# Patient Record
Sex: Male | Born: 1975 | Race: Black or African American | Hispanic: No | Marital: Married | Smoking: Never smoker
Health system: Southern US, Community
[De-identification: ages and names within clinical notes are randomized; demographics above are authoritative.]

## PROBLEM LIST (undated history)

## (undated) DIAGNOSIS — N050 Unspecified nephritic syndrome with minor glomerular abnormality: Secondary | ICD-10-CM

## (undated) DIAGNOSIS — M1990 Unspecified osteoarthritis, unspecified site: Secondary | ICD-10-CM

## (undated) HISTORY — PX: NO PAST SURGERIES: SHX2092

## (undated) HISTORY — PX: JOINT REPLACEMENT: SHX530

---

## 1998-01-27 ENCOUNTER — Observation Stay (HOSPITAL_COMMUNITY): Admission: EM | Admit: 1998-01-27 | Discharge: 1998-01-28 | Payer: Self-pay | Admitting: Emergency Medicine

## 1998-02-01 ENCOUNTER — Encounter: Admission: RE | Admit: 1998-02-01 | Discharge: 1998-02-01 | Payer: Self-pay | Admitting: Family Medicine

## 1999-12-25 ENCOUNTER — Emergency Department (HOSPITAL_COMMUNITY): Admission: EM | Admit: 1999-12-25 | Discharge: 1999-12-25 | Payer: Self-pay | Admitting: Emergency Medicine

## 2005-03-12 ENCOUNTER — Emergency Department (HOSPITAL_COMMUNITY): Admission: EM | Admit: 2005-03-12 | Discharge: 2005-03-13 | Payer: Self-pay | Admitting: Emergency Medicine

## 2005-03-14 ENCOUNTER — Emergency Department: Payer: Self-pay | Admitting: Emergency Medicine

## 2005-03-28 ENCOUNTER — Encounter: Admission: RE | Admit: 2005-03-28 | Discharge: 2005-03-28 | Payer: Self-pay | Admitting: Neurology

## 2010-07-20 ENCOUNTER — Other Ambulatory Visit (HOSPITAL_COMMUNITY): Payer: Self-pay | Admitting: Nephrology

## 2010-07-20 DIAGNOSIS — R809 Proteinuria, unspecified: Secondary | ICD-10-CM

## 2010-07-28 ENCOUNTER — Ambulatory Visit (HOSPITAL_COMMUNITY)
Admission: RE | Admit: 2010-07-28 | Discharge: 2010-07-28 | Disposition: A | Discharge status: Home / Self Care | Payer: BC Managed Care – PPO | Source: Ambulatory Visit | Attending: Nephrology | Admitting: Nephrology

## 2010-07-28 ENCOUNTER — Other Ambulatory Visit: Payer: Self-pay | Admitting: Interventional Radiology

## 2010-07-28 DIAGNOSIS — R809 Proteinuria, unspecified: Secondary | ICD-10-CM | POA: Insufficient documentation

## 2010-07-28 DIAGNOSIS — N059 Unspecified nephritic syndrome with unspecified morphologic changes: Secondary | ICD-10-CM | POA: Insufficient documentation

## 2010-07-28 DIAGNOSIS — Z01812 Encounter for preprocedural laboratory examination: Secondary | ICD-10-CM | POA: Insufficient documentation

## 2010-07-28 LAB — CBC
HCT: 47.1 % (ref 39.0–52.0)
Hemoglobin: 16.5 g/dL (ref 13.0–17.0)
MCH: 30.4 pg (ref 26.0–34.0)
MCHC: 35 g/dL (ref 30.0–36.0)
MCV: 86.7 fL (ref 78.0–100.0)
Platelets: 225 10*3/uL (ref 150–400)
RDW: 12.8 % (ref 11.5–15.5)
WBC: 8.1 10*3/uL (ref 4.0–10.5)

## 2010-07-28 LAB — APTT: aPTT: 31 seconds (ref 24–37)

## 2010-07-28 LAB — PROTIME-INR
INR: 0.89 (ref 0.00–1.49)
Prothrombin Time: 12.3 seconds (ref 11.6–15.2)

## 2010-09-04 ENCOUNTER — Emergency Department (HOSPITAL_COMMUNITY)
Admission: EM | Admit: 2010-09-04 | Discharge: 2010-09-04 | Disposition: A | Discharge status: Home / Self Care | Payer: BC Managed Care – PPO | Attending: Emergency Medicine | Admitting: Emergency Medicine

## 2010-09-04 DIAGNOSIS — Z79899 Other long term (current) drug therapy: Secondary | ICD-10-CM | POA: Insufficient documentation

## 2010-09-04 DIAGNOSIS — M79609 Pain in unspecified limb: Secondary | ICD-10-CM | POA: Insufficient documentation

## 2010-09-04 DIAGNOSIS — M25569 Pain in unspecified knee: Secondary | ICD-10-CM | POA: Insufficient documentation

## 2010-09-04 DIAGNOSIS — I10 Essential (primary) hypertension: Secondary | ICD-10-CM | POA: Insufficient documentation

## 2010-09-04 LAB — CBC
HCT: 42.2 % (ref 39.0–52.0)
Hemoglobin: 14.3 g/dL (ref 13.0–17.0)
MCH: 29.4 pg (ref 26.0–34.0)
MCHC: 33.9 g/dL (ref 30.0–36.0)
MCV: 86.8 fL (ref 78.0–100.0)
Platelets: 135 10*3/uL — ABNORMAL LOW (ref 150–400)
RDW: 13.9 % (ref 11.5–15.5)
WBC: 14.7 10*3/uL — ABNORMAL HIGH (ref 4.0–10.5)

## 2010-09-04 LAB — BASIC METABOLIC PANEL
BUN: 17 mg/dL (ref 6–23)
CO2: 28 mEq/L (ref 19–32)
Calcium: 8.8 mg/dL (ref 8.4–10.5)
Creatinine, Ser: 0.81 mg/dL (ref 0.4–1.5)
GFR calc Af Amer: >60 mL/min (ref >60)
GFR calc non Af Amer: >60 mL/min (ref >60)
Glucose, Bld: 91 mg/dL (ref 70–99)
Potassium: 3.4 mEq/L — ABNORMAL LOW (ref 3.5–5.1)
Sodium: 139 mEq/L (ref 135–145)

## 2010-09-04 LAB — DIFFERENTIAL
Basophils Absolute: 0 10*3/uL (ref 0.0–0.1)
Basophils Relative: 0 % (ref 0–1)
Eosinophils Absolute: 0 10*3/uL (ref 0.0–0.7)
Eosinophils Relative: 0 % (ref 0–5)
Lymphocytes Relative: 32 % (ref 12–46)
Lymphs Abs: 4.7 10*3/uL — ABNORMAL HIGH (ref 0.7–4.0)
Monocytes Absolute: 0.8 10*3/uL (ref 0.1–1.0)
Monocytes Relative: 5 % (ref 3–12)
Neutrophils Relative %: 62 % (ref 43–77)

## 2010-09-04 LAB — SEDIMENTATION RATE: Sed Rate: 16 mm/hr (ref 0–16)

## 2014-02-02 ENCOUNTER — Emergency Department (HOSPITAL_COMMUNITY)
Admission: EM | Admit: 2014-02-02 | Discharge: 2014-02-02 | Disposition: A | Discharge status: Home / Self Care | Payer: BC Managed Care – PPO | Attending: Emergency Medicine | Admitting: Emergency Medicine

## 2014-02-02 ENCOUNTER — Encounter (HOSPITAL_COMMUNITY): Payer: Self-pay | Admitting: Emergency Medicine

## 2014-02-02 ENCOUNTER — Other Ambulatory Visit: Payer: Self-pay

## 2014-02-02 ENCOUNTER — Emergency Department (HOSPITAL_COMMUNITY): Payer: BC Managed Care – PPO

## 2014-02-02 DIAGNOSIS — Z87448 Personal history of other diseases of urinary system: Secondary | ICD-10-CM | POA: Diagnosis not present

## 2014-02-02 DIAGNOSIS — R1011 Right upper quadrant pain: Secondary | ICD-10-CM | POA: Diagnosis present

## 2014-02-02 DIAGNOSIS — R0789 Other chest pain: Secondary | ICD-10-CM

## 2014-02-02 HISTORY — DX: Unspecified nephritic syndrome with minor glomerular abnormality: N05.0

## 2014-02-02 MED ORDER — ACETAMINOPHEN 325 MG PO TABS
650.0000 mg | ORAL_TABLET | Freq: Once | ORAL | Status: AC
  Administered 2014-02-02: 650 mg via ORAL
  Filled 2014-02-02: qty 2

## 2014-02-02 NOTE — Discharge Instructions (Signed)
Chest Wall Pain °Chest wall pain is pain in or around the bones and muscles of your chest. It may take up to 6 weeks to get better. It may take longer if you must stay physically active in your work and activities.  °CAUSES  °Chest wall pain may happen on its own. However, it may be caused by: °· A viral illness like the flu. °· Injury. °· Coughing. °· Exercise. °· Arthritis. °· Fibromyalgia. °· Shingles. °HOME CARE INSTRUCTIONS  °· Avoid overtiring physical activity. Try not to strain or perform activities that cause pain. This includes any activities using your chest or your abdominal and side muscles, especially if heavy weights are used. °· Put ice on the sore area. °· Put ice in a plastic bag. °· Place a towel between your skin and the bag. °· Leave the ice on for 15-20 minutes per hour while awake for the first 2 days. °· Only take over-the-counter or prescription medicines for pain, discomfort, or fever as directed by your caregiver. °SEEK IMMEDIATE MEDICAL CARE IF:  °· Your pain increases, or you are very uncomfortable. °· You have a fever. °· Your chest pain becomes worse. °· You have new, unexplained symptoms. °· You have nausea or vomiting. °· You feel sweaty or lightheaded. °· You have a cough with phlegm (sputum), or you cough up blood. °MAKE SURE YOU:  °· Understand these instructions. °· Will watch your condition. °· Will get help right away if you are not doing well or get worse. °Document Released: 04/10/2005 Document Revised: 07/03/2011 Document Reviewed: 12/05/2010 °[employer]® Patient Information ©2015 [employer]. This information is not intended to replace advice given to you by your health care provider. Make sure you discuss any questions you have with your health care provider. °Heat Therapy °Heat therapy can help ease sore, stiff, injured, and tight muscles and joints. Heat relaxes your muscles, which may help ease your pain.  °RISKS AND COMPLICATIONS °If you have any of the following  conditions, do not use heat therapy unless your health care provider has approved: °· Poor circulation. °· Healing wounds or scarred skin in the area being treated. °· Diabetes, heart disease, or high blood pressure. °· Not being able to feel (numbness) the area being treated. °· Unusual swelling of the area being treated. °· Active infections. °· Blood clots. °· Cancer. °· Inability to communicate pain. This may include young children and people who have problems with their brain function (dementia). °· Pregnancy. °Heat therapy should only be used on old, pre-existing, or long-lasting (chronic) injuries. Do not use heat therapy on new injuries unless directed by your health care provider. °HOW TO USE HEAT THERAPY °There are several different kinds of heat therapy, including: °· Moist heat pack. °· Warm water bath. °· Hot water bottle. °· Electric heating pad. °· Heated gel pack. °· Heated wrap. °· Electric heating pad. °Use the heat therapy method suggested by your health care provider. Follow your health care provider's instructions on when and how to use heat therapy. °GENERAL HEAT THERAPY RECOMMENDATIONS °· Do not sleep while using heat therapy. Only use heat therapy while you are awake. °· Your skin may turn pink while using heat therapy. Do not use heat therapy if your skin turns red. °· Do not use heat therapy if you have new pain. °· High heat or long exposure to heat can cause burns. Be careful when using heat therapy to avoid burning your skin. °· Do not use heat therapy on areas   of your skin that are already irritated, such as with a rash or sunburn. °SEEK MEDICAL CARE IF: °· You have blisters, redness, swelling, or numbness. °· You have new pain. °· Your pain is worse. °MAKE SURE YOU: °· Understand these instructions. °· Will watch your condition. °· Will get help right away if you are not doing well or get worse. °Document Released: 07/03/2011 Document Revised: 08/25/2013 Document Reviewed:  06/03/2013 °[employer]® Patient Information ©2015 [employer]. This information is not intended to replace advice given to you by your health care provider. Make sure you discuss any questions you have with your health care provider. ° °

## 2014-02-02 NOTE — ED Provider Notes (Signed)
CSN: [id number]     Arrival date & time 02/02/14  0919 History  This chart was scribed for non-physician practitioner Elpidio AnisShari Sheryl Saintil, PA-C working with Purvis SheffieldForrest Harrison, MD by Conchita ParisNadim Abuhashem, ED Scribe. This patient was seen in TR07C/TR07C and the patient's care was started at 9:37 AM.     Chief Complaint  Patient presents with  . Flank Pain   Patient is a 38 y.o. male presenting with flank pain. The history is provided by the patient. No language interpreter was used.  Flank Pain Associated symptoms include abdominal pain. Pertinent negatives include no chest pain, no headaches and no shortness of breath.  Flank Pain Associated symptoms include abdominal pain. Pertinent negatives include no chest pain, chills, fever, headaches, nausea, numbness or vomiting.   HPI Comments: Ranelle OysterRoderick A Jasinski is a 38 y.o. male  with a history of "minimal change" (kidney) disease who presents to the Emergency Department complaining of sharp, constant, non radiating, upper right sided abdomen and flank pain rated as 7/10 which began yesterday afternoon with gradual onset. Pt says that he was not doing anything at the onset of the pain but he was lifting heavy appliances for the last couple days. No other muscular soreness (ie no back, arm, leg pain). Pt says he took two 500 mg tylenol which provided some mild relief. He notes movement and breathing causes more pain but denies SOB, cough or fever. If he does not move it does not cause pain. Pt mentioned he had pain on the left side near his back  Friday night which gradually came and gradually went away. Pt denies SOB, CP, fever, chills, weight loss, loss of BM/urine, dysuria, frequent urination, dizziness, HA, hematuria, CAD, appetite change, blurry vision, hearing loss, abdominal pain, numbness in extremities, trouble with ambulate.   Past Medical History  Diagnosis Date  . Minimal change disease    History reviewed. No pertinent past surgical history. No  family history on file. History  Substance Use Topics  . Smoking status: Never Smoker   . Smokeless tobacco: Not on file  . Alcohol Use: Yes    Review of Systems  Constitutional: Negative for fever, chills and activity change.  Eyes: Negative for visual disturbance.  Respiratory: Negative for shortness of breath.   Cardiovascular: Negative for chest pain.  Gastrointestinal: Positive for abdominal pain. Negative for nausea and vomiting.  Genitourinary: Positive for flank pain. Negative for dysuria, hematuria and difficulty urinating.  Musculoskeletal: Negative for gait problem.  Neurological: Negative for dizziness, numbness and headaches.      Allergies  Review of patient's allergies indicates no known allergies.  Home Medications   Prior to Admission medications   Not on File   BP 118/70  Pulse 71  Temp(Src) 98.7 F (37.1 C) (Oral)  Resp 20  Ht 5\' 8"  (1.727 m)  Wt 240 lb (108.863 kg)  BMI 36.50 kg/m2  SpO2 96% Physical Exam  Nursing note and vitals reviewed. Constitutional: He is oriented to person, place, and time. He appears well-developed and well-nourished. No distress.  HENT:  Head: Normocephalic and atraumatic.  Eyes: EOM are normal.  Neck: Normal range of motion.  Cardiovascular: Normal rate.   Pulmonary/Chest: Effort normal.  Lungs clear bilaterally  No reproduclably chest wall tenderness   Abdominal:  Abdomen completely non-tender, specifically no RUQ tenderness. No organomegaly. Abdomen is nondistended Normal BM  Musculoskeletal:  No tenderness to muscular back   Neurological: He is alert and oriented to person, place, and time.  Skin: Skin  is warm and dry.  Psychiatric: He has a normal mood and affect. His behavior is normal.    ED Course  Procedures (including critical care time) Labs Review Labs Reviewed - No data to display  Imaging Review No results found.   EKG Interpretation None     Dg Chest 2 View  02/02/2014   CLINICAL  DATA:  RIGHT lower chest pain.  EXAM: CHEST  2 VIEW  COMPARISON:  None.  FINDINGS: The heart size and mediastinal contours are within normal limits. Both lungs are clear. The visualized skeletal structures are unremarkable.  IMPRESSION: No active cardiopulmonary disease.   Electronically Signed   By: Davonna BellingJohn  Curnes M.D.   On: 02/02/2014 10:26   MDM   Final diagnoses:  None  I personally performed the services described in this documentation, which was scribed in my presence. The recorded information has been reviewed and is accurate.     1. Chest wall pain  Patient is Wells and PERC negative. No tachycardia/tachypnea/hypoxia. No evidence to support acute infection/PNA. No abdominal pain to cause concern for etiology of pain. Dx - chest wall pain. Return precautions provided - Return to ED with any severe pain, fever, cough, SOB, abdominal pain, N, VArnoldo Hooker.    Amarrion Pastorino A Christl Fessenden, PA-C 02/02/14 1123

## 2014-02-02 NOTE — ED Provider Notes (Addendum)
Medical screening examination/treatment/procedure(s) were conducted as a shared visit with non-physician practitioner(s) and myself.  I personally evaluated the patient during the encounter.   Date: 02/02/2014  Rate: 70  Rhythm: normal sinus rhythm  QRS Axis: normal  Intervals: normal  ST/T Wave abnormalities: nonspecific T wave changes  Conduction Disutrbances:none  Narrative Interpretation: Non-spec t wave changes  Old EKG Reviewed: none available  I interviewed and examined the patient. Lungs are CTAB. Cardiac exam wnl. Abdomen soft. No focal ttp of flank. Pain reproduced w/ twisting of the torso. Likely msk in nature. Wells/perc neg. ECG non-contrib. Do not think this is ACS. Will try course of pain medicine and rec return for any worsening.   Purvis SheffieldForrest Leslie Langille, MD 02/02/14 1620  Purvis SheffieldForrest Mikiya Nebergall, MD 02/02/14 2007

## 2014-02-02 NOTE — ED Notes (Addendum)
Pt right flank pain onset yesterday afternoon only with movement. Pt denies urinary symptoms or N/V. Pt reports that he has been moving appliances recently.

## 2015-03-04 ENCOUNTER — Ambulatory Visit
Admission: RE | Admit: 2015-03-04 | Discharge: 2015-03-04 | Disposition: A | Discharge status: Home / Self Care | Payer: BLUE CROSS/BLUE SHIELD | Source: Ambulatory Visit | Attending: Family Medicine | Admitting: Family Medicine

## 2015-03-04 ENCOUNTER — Other Ambulatory Visit: Payer: Self-pay | Admitting: Family Medicine

## 2015-03-04 DIAGNOSIS — M25561 Pain in right knee: Secondary | ICD-10-CM

## 2015-03-04 DIAGNOSIS — M25551 Pain in right hip: Secondary | ICD-10-CM

## 2015-08-17 ENCOUNTER — Ambulatory Visit: Payer: Self-pay | Admitting: Orthopedic Surgery

## 2015-09-15 ENCOUNTER — Ambulatory Visit: Payer: Self-pay | Admitting: Orthopedic Surgery

## 2015-09-15 NOTE — H&P (Signed)
TOTAL HIP ADMISSION H&P  Patient is admitted for right total hip arthroplasty.  Subjective:  Chief Complaint: right hip pain  HPI: Cole Richardson, 40 y.o. 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  Onset of symptoms was abrupt starting 1 years ago with rapidlly worsening course since that time.The patient noted no past surgery on the right hip(s).  Patient currently rates pain in the right hip at 10 out of 10 with activity. Patient has night pain, worsening of pain with activity and weight bearing, pain that interfers with activities of daily living and pain with passive range of motion. Patient has evidence of AVN by imaging studies. This condition presents safety issues increasing the risk of falls. There is no current active infection.  There are no active problems to display for this patient.  Past Medical History  Diagnosis Date  . Minimal change disease     No past surgical history on file.   (Not in a hospital admission) No Known Allergies  Social History  Substance Use Topics  . Smoking status: Never Smoker   . Smokeless tobacco: Not on file  . Alcohol Use: Yes    No family history on file.   Review of Systems  Constitutional: Negative.   HENT: Negative.   Eyes: Negative.   Respiratory: Negative.   Cardiovascular: Negative.   Gastrointestinal: Negative.   Genitourinary: Negative.   Musculoskeletal: Positive for joint pain.  Skin: Negative.   Neurological: Negative.   Endo/Heme/Allergies: Negative.   Psychiatric/Behavioral: Negative.     Objective:  Physical Exam  Vitals reviewed. Constitutional: He is oriented to person, place, and time. He  appears well-developed and well-nourished.  HENT:  Head: Normocephalic and atraumatic.  Eyes: Conjunctivae and EOM are normal. Pupils are equal, round, and reactive to light.  Neck: Normal range of motion. Neck supple.  Cardiovascular: Normal rate and regular rhythm.   Respiratory: Effort normal and breath sounds normal. No respiratory distress.  GI: Soft. Bowel sounds are normal. He exhibits no distension.  Genitourinary:  deferred  Musculoskeletal:       Right hip: He exhibits decreased range of motion.  Neurological: He is alert and oriented to person, place, and time. He has normal reflexes.  Skin: Skin is warm and dry.  Psychiatric: He has a normal mood and affect. His behavior is normal. Judgment and thought content normal.    Vital signs in last 24 hours: @VSRANGES @  Labs:   Estimated body mass index is 36.50 kg/(m^2) as calculated from the following:   Height as of 02/02/14: 5\' 8"  (1.727 m).   Weight as of 02/02/14: 108.863 kg (240 lb).   Imaging Review Plain radiographs demonstrate moderate degenerative joint disease of the right hip(s). The bone quality appears to be adequate for age and reported activity level. AVN with collapse.  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 The treatment options including medical management, injection therapy, arthroscopy and arthroplasty were discussed at length. The risks and benefits of total hip arthroplasty were presented and reviewed. The risks due to aseptic loosening, infection,  stiffness, dislocation/subluxation,  thromboembolic complications and other imponderables were discussed.  The patient acknowledged the explanation, agreed to proceed with the plan and consent was signed. Patient is being admitted  for inpatient treatment for surgery, pain control, PT, OT, prophylactic antibiotics, VTE prophylaxis, progressive ambulation and ADL's and discharge planning.The patient is planning to be discharged home with home health services

## 2015-09-22 NOTE — Patient Instructions (Addendum)
Cole Richardson  09/22/2015   Your procedure is scheduled on: 09/30/15  Report to [facility] Main  Entrance take [facility]  elevators to 3rd floor to Short Stay Center at 2:30 PM.  Call this number if you have problems the morning of surgery [phone]   Remember: ONLY 1 PERSON MAY GO WITH YOU TO SHORT STAY TO GET  READY MORNING OF YOUR SURGERY.  Do not eat food:After Midnight.             Clear liquids allowed until 10:30 am on 09/30/15     Take these medicines the morning of surgery with A SIP OF WATER: none                                You may not have any metal on your body including hair pins and              piercings  Do not wear jewelry, make-up, lotions, powders, deodorant              Do not shave  48 hours prior to surgery.              Men may shave face and neck.   Do not bring valuables to the hospital. [facility] IS NOT             RESPONSIBLE   FOR VALUABLES.  Contacts, dentures or bridgework may not be worn into surgery.  Leave suitcase in the car. After surgery it may be brought to your room.    CLEAR LIQUID DIET   Foods Allowed                                                                     Foods Excluded  Coffee and tea, regular and decaf                             liquids that you cannot  Plain Jell-O in any flavor                                             see through such as: Fruit ices (not with fruit pulp)                                     milk, soups, orange juice  Iced Popsicles                                    All solid food Carbonated beverages, regular and diet                                    Cranberry, grape and apple juices Sports drinks like Gatorade  Lightly seasoned clear broth or consume(fat free) Sugar, honey syrup    Sample Menu Breakfast                                Lunch                                     Supper Cranberry juice                    Beef broth                            Chicken  broth Jell-O                                     Grape juice                           Apple juice Coffee or tea                        Jell-O                                      Popsicle                                                Coffee or tea                        Coffee or tea  _____________________________________________________________________   _____________________________________________________________________             [facility] - Preparing for Surgery Before surgery, you can play an important role.  Because skin is not sterile, your skin needs to be as free of germs as possible.  You can reduce the number of germs on your skin by washing with CHG (chlorahexidine gluconate) soap before surgery.  CHG is an antiseptic cleaner which kills germs and bonds with the skin to continue killing germs even after washing. Please DO NOT use if you have an allergy to CHG or antibacterial soaps.  If your skin becomes reddened/irritated stop using the CHG and inform your nurse when you arrive at Short Stay. Do not shave (including legs and underarms) for at least 48 hours prior to the first CHG shower.  You may shave your face/neck. Please follow these instructions carefully:  1.  Shower with CHG Soap the night before surgery and the  morning of Surgery.  2.  If you choose to wash your hair, wash your hair first as usual with your  normal  shampoo.  3.  After you shampoo, rinse your hair and body thoroughly to remove the  shampoo.                             4.  Use CHG as you would any other liquid soap.  You can apply chg directly  to the skin and wash                       Gently with a scrungie or clean washcloth.  5.  Apply the CHG Soap to your body ONLY FROM THE NECK DOWN.   Do not use on face/ open                           Wound or open sores. Avoid contact with eyes, ears mouth and genitals (private parts).                       Wash face,  Genitals (private parts) with your  normal soap.             6.  Wash thoroughly, paying special attention to the area where your surgery  will be performed.  7.  Thoroughly rinse your body with warm water from the neck down.  8.  DO NOT shower/wash with your normal soap after using and rinsing off  the CHG Soap.                9.  Pat yourself dry with a clean towel.            10.  Wear clean pajamas.            11.  Place clean sheets on your bed the night of your first shower and do not  sleep with pets. Day of Surgery : Do not apply any lotions/deodorants the morning of surgery.  Please wear clean clothes to the hospital/surgery center.  FAILURE TO FOLLOW THESE INSTRUCTIONS MAY RESULT IN THE CANCELLATION OF YOUR SURGERY PATIENT SIGNATURE_________________________________  NURSE SIGNATURE__________________________________  ________________________________________________________________________   [name]  An incentive spirometer is a tool that can help keep your lungs clear and active. This tool measures how well you are filling your lungs with each breath. Taking long deep breaths may help reverse or decrease the chance of developing breathing (pulmonary) problems (especially infection) following:  A long period of time when you are unable to move or be active. BEFORE THE PROCEDURE   If the spirometer includes an indicator to show your best effort, your nurse or respiratory therapist will set it to a desired goal.  If possible, sit up straight or lean slightly forward. Try not to slouch.  Hold the incentive spirometer in an upright position. INSTRUCTIONS FOR USE   Sit on the edge of your bed if possible, or sit up as far as you can in bed or on a chair.  Hold the incentive spirometer in an upright position.  Breathe out normally.  Place the mouthpiece in your mouth and seal your lips tightly around it.  Breathe in slowly and as deeply as possible, raising the piston or the ball toward the top of  the column.  Hold your breath for 3-5 seconds or for as long as possible. Allow the piston or ball to fall to the bottom of the column.  Remove the mouthpiece from your mouth and breathe out normally.  Rest for a few seconds and repeat Steps 1 through 7 at least 10 times every 1-2 hours when you are awake. Take your time and take a few normal breaths between deep breaths.  The spirometer may include an indicator to show your best effort. Use the indicator as a goal to work toward during each repetition.  After  each set of 10 deep breaths, practice coughing to be sure your lungs are clear. If you have an incision (the cut made at the time of surgery), support your incision when coughing by placing a pillow or rolled up towels firmly against it. Once you are able to get out of bed, walk around indoors and cough well. You may stop using the incentive spirometer when instructed by your caregiver.  RISKS AND COMPLICATIONS  Take your time so you do not get dizzy or light-headed.  If you are in pain, you may need to take or ask for pain medication before doing incentive spirometry. It is harder to take a deep breath if you are having pain. AFTER USE  Rest and breathe slowly and easily.  It can be helpful to keep track of a log of your progress. Your caregiver can provide you with a simple table to help with this. If you are using the spirometer at home, follow these instructions: SEEK MEDICAL CARE IF:   You are having difficultly using the spirometer.  You have trouble using the spirometer as often as instructed.  Your pain medication is not giving enough relief while using the spirometer.  You develop fever of 100.5 F (38.1 C) or higher. SEEK IMMEDIATE MEDICAL CARE IF:   You cough up bloody sputum that had not been present before.  You develop fever of 102 F (38.9 C) or greater.  You develop worsening pain at or near the incision site. MAKE SURE YOU:   Understand these  instructions.  Will watch your condition.  Will get help right away if you are not doing well or get worse. Document Released: 08/21/2006 Document Revised: 07/03/2011 Document Reviewed: 10/22/2006 Exit[employer] Patient Information 2014 Exit[employer], [place].   ________________________________________________________________________  WHAT IS A BLOOD TRANSFUSION? Blood Transfusion Information  A transfusion is the replacement of blood or some of its parts. Blood is made up of multiple cells which provide different functions.  Red blood cells carry oxygen and are used for blood loss replacement.  White blood cells fight against infection.  Platelets control bleeding.  Plasma helps clot blood.  Other blood products are available for specialized needs, such as hemophilia or other clotting disorders. BEFORE THE TRANSFUSION  Who gives blood for transfusions?   Healthy volunteers who are fully evaluated to make sure their blood is safe. This is blood bank blood. Transfusion therapy is the safest it has ever been in the practice of medicine. Before blood is taken from a donor, a complete history is taken to make sure that person has no history of diseases nor engages in risky social behavior (examples are intravenous drug use or sexual activity with multiple partners). The donor's travel history is screened to minimize risk of transmitting infections, such as malaria. The donated blood is tested for signs of infectious diseases, such as HIV and hepatitis. The blood is then tested to be sure it is compatible with you in order to minimize the chance of a transfusion reaction. If you or a relative donates blood, this is often done in anticipation of surgery and is not appropriate for emergency situations. It takes many days to process the donated blood. RISKS AND COMPLICATIONS Although transfusion therapy is very safe and saves many lives, the main dangers of transfusion include:   Getting an infectious  disease.  Developing a transfusion reaction. This is an allergic reaction to something in the blood you were given. Every precaution is taken to prevent this. The decision to  have a blood transfusion has been considered carefully by your caregiver before blood is given. Blood is not given unless the benefits outweigh the risks. AFTER THE TRANSFUSION  Right after receiving a blood transfusion, you will usually feel much better and more energetic. This is especially true if your red blood cells have gotten low (anemic). The transfusion raises the level of the red blood cells which carry oxygen, and this usually causes an energy increase.  The nurse administering the transfusion will monitor you carefully for complications. HOME CARE INSTRUCTIONS  No special instructions are needed after a transfusion. You may find your energy is better. Speak with your caregiver about any limitations on activity for underlying diseases you may have. SEEK MEDICAL CARE IF:   Your condition is not improving after your transfusion.  You develop redness or irritation at the intravenous (IV) site. SEEK IMMEDIATE MEDICAL CARE IF:  Any of the following symptoms occur over the next 12 hours:  Shaking chills.  You have a temperature by mouth above 102 F (38.9 C), not controlled by medicine.  Chest, back, or muscle pain.  People around you feel you are not acting correctly or are confused.  Shortness of breath or difficulty breathing.  Dizziness and fainting.  You get a rash or develop hives.  You have a decrease in urine output.  Your urine turns a dark color or changes to pink, red, or brown. Any of the following symptoms occur over the next 10 days:  You have a temperature by mouth above 102 F (38.9 C), not controlled by medicine.  Shortness of breath.  Weakness after normal activity.  The white part of the eye turns yellow (jaundice).  You have a decrease in the amount of urine or are  urinating less often.  Your urine turns a dark color or changes to pink, red, or brown. Document Released: 04/07/2000 Document Revised: 07/03/2011 Document Reviewed: 11/25/2007 [facility] Patient Information 2014 [place], [place].  _______________________________________________________________________

## 2015-09-24 ENCOUNTER — Encounter (HOSPITAL_COMMUNITY)
Admission: RE | Admit: 2015-09-24 | Discharge: 2015-09-24 | Disposition: A | Discharge status: Home / Self Care | Payer: BLUE CROSS/BLUE SHIELD | Source: Ambulatory Visit | Attending: Orthopedic Surgery | Admitting: Orthopedic Surgery

## 2015-09-24 ENCOUNTER — Encounter (HOSPITAL_COMMUNITY): Payer: Self-pay

## 2015-09-24 DIAGNOSIS — M879 Osteonecrosis, unspecified: Secondary | ICD-10-CM | POA: Insufficient documentation

## 2015-09-24 DIAGNOSIS — Z01812 Encounter for preprocedural laboratory examination: Secondary | ICD-10-CM | POA: Insufficient documentation

## 2015-09-24 DIAGNOSIS — Z0183 Encounter for blood typing: Secondary | ICD-10-CM | POA: Insufficient documentation

## 2015-09-24 LAB — CBC
HCT: 44.9 % (ref 39.0–52.0)
Hemoglobin: 15.5 g/dL (ref 13.0–17.0)
MCH: 30.6 pg (ref 26.0–34.0)
MCHC: 34.5 g/dL (ref 30.0–36.0)
MCV: 88.6 fL (ref 78.0–100.0)
PLATELETS: 189 10*3/uL (ref 150–400)
RBC: 5.07 MIL/uL (ref 4.22–5.81)
RDW: 13.2 % (ref 11.5–15.5)
WBC: 12.3 10*3/uL — AB (ref 4.0–10.5)

## 2015-09-24 LAB — SURGICAL PCR SCREEN
MRSA, PCR: NEGATIVE
STAPHYLOCOCCUS AUREUS: NEGATIVE

## 2015-09-24 LAB — ABO/RH: ABO/RH(D): O POS

## 2015-09-24 NOTE — Pre-Procedure Instructions (Signed)
Medical Clearance, Dr. Nathanial RancherHamrick, on chart Clearance, Dr. Charm BargesButler, DDS, on chart

## 2015-09-30 ENCOUNTER — Inpatient Hospital Stay (HOSPITAL_COMMUNITY): Payer: BLUE CROSS/BLUE SHIELD

## 2015-09-30 ENCOUNTER — Encounter (HOSPITAL_COMMUNITY)
Admission: RE | Disposition: A | Discharge status: Home Health | Payer: Self-pay | Source: Ambulatory Visit | Attending: Orthopedic Surgery

## 2015-09-30 ENCOUNTER — Inpatient Hospital Stay (HOSPITAL_COMMUNITY)
Admission: RE | Admit: 2015-09-30 | Discharge: 2015-10-02 | DRG: 470 | Disposition: A | Discharge status: Home Health | Payer: BLUE CROSS/BLUE SHIELD | Source: Ambulatory Visit | Attending: Orthopedic Surgery | Admitting: Orthopedic Surgery

## 2015-09-30 ENCOUNTER — Inpatient Hospital Stay (HOSPITAL_COMMUNITY): Payer: BLUE CROSS/BLUE SHIELD | Admitting: Anesthesiology

## 2015-09-30 ENCOUNTER — Encounter (HOSPITAL_COMMUNITY): Payer: Self-pay | Admitting: *Deleted

## 2015-09-30 DIAGNOSIS — R112 Nausea with vomiting, unspecified: Secondary | ICD-10-CM | POA: Diagnosis not present

## 2015-09-30 DIAGNOSIS — M25551 Pain in right hip: Secondary | ICD-10-CM | POA: Diagnosis not present

## 2015-09-30 DIAGNOSIS — R202 Paresthesia of skin: Secondary | ICD-10-CM | POA: Diagnosis not present

## 2015-09-30 DIAGNOSIS — M87051 Idiopathic aseptic necrosis of right femur: Secondary | ICD-10-CM | POA: Diagnosis not present

## 2015-09-30 DIAGNOSIS — M87059 Idiopathic aseptic necrosis of unspecified femur: Secondary | ICD-10-CM | POA: Diagnosis present

## 2015-09-30 DIAGNOSIS — M25751 Osteophyte, right hip: Secondary | ICD-10-CM | POA: Diagnosis present

## 2015-09-30 DIAGNOSIS — M879 Osteonecrosis, unspecified: Principal | ICD-10-CM | POA: Diagnosis present

## 2015-09-30 DIAGNOSIS — Z471 Aftercare following joint replacement surgery: Secondary | ICD-10-CM | POA: Diagnosis not present

## 2015-09-30 DIAGNOSIS — E669 Obesity, unspecified: Secondary | ICD-10-CM | POA: Diagnosis not present

## 2015-09-30 DIAGNOSIS — Z6835 Body mass index (BMI) 35.0-35.9, adult: Secondary | ICD-10-CM | POA: Diagnosis not present

## 2015-09-30 DIAGNOSIS — Z09 Encounter for follow-up examination after completed treatment for conditions other than malignant neoplasm: Secondary | ICD-10-CM

## 2015-09-30 DIAGNOSIS — Z96641 Presence of right artificial hip joint: Secondary | ICD-10-CM | POA: Diagnosis not present

## 2015-09-30 DIAGNOSIS — Z419 Encounter for procedure for purposes other than remedying health state, unspecified: Secondary | ICD-10-CM

## 2015-09-30 HISTORY — PX: TOTAL HIP ARTHROPLASTY: SHX124

## 2015-09-30 LAB — TYPE AND SCREEN
ABO/RH(D): O POS
ANTIBODY SCREEN: NEGATIVE

## 2015-09-30 SURGERY — ARTHROPLASTY, HIP, TOTAL, ANTERIOR APPROACH
Anesthesia: Spinal | Site: Hip | Laterality: Right

## 2015-09-30 MED ORDER — METOCLOPRAMIDE HCL 5 MG/ML IJ SOLN
5.0000 mg | Freq: Three times a day (TID) | INTRAMUSCULAR | Status: DC | PRN
  Administered 2015-10-01 (×2): 10 mg via INTRAVENOUS
  Filled 2015-09-30 (×3): qty 2

## 2015-09-30 MED ORDER — SODIUM CHLORIDE 0.9 % IV SOLN: INTRAVENOUS | Status: DC

## 2015-09-30 MED ORDER — HYDROGEN PEROXIDE 3 % EX SOLN
CUTANEOUS | Status: AC
  Filled 2015-09-30: qty 473

## 2015-09-30 MED ORDER — SODIUM CHLORIDE 0.9 % IV SOLN
INTRAVENOUS | Status: DC
  Administered 2015-09-30 – 2015-10-01 (×3): via INTRAVENOUS

## 2015-09-30 MED ORDER — HYDROCODONE-ACETAMINOPHEN 5-325 MG PO TABS
1.0000 | ORAL_TABLET | ORAL | Status: DC | PRN
  Administered 2015-09-30: 1 via ORAL
  Filled 2015-09-30 (×2): qty 1
  Filled 2015-09-30 (×2): qty 2

## 2015-09-30 MED ORDER — BUPIVACAINE-EPINEPHRINE 0.25% -1:200000 IJ SOLN
INTRAMUSCULAR | Status: AC
  Filled 2015-09-30: qty 1

## 2015-09-30 MED ORDER — MENTHOL 3 MG MT LOZG: 1.0000 | LOZENGE | OROMUCOSAL | Status: DC | PRN

## 2015-09-30 MED ORDER — CHLORHEXIDINE GLUCONATE 4 % EX LIQD: 60.0000 mL | Freq: Once | CUTANEOUS | Status: DC

## 2015-09-30 MED ORDER — TRANEXAMIC ACID 1000 MG/10ML IV SOLN
1000.0000 mg | Freq: Once | INTRAVENOUS | Status: AC
  Administered 2015-09-30: 1000 mg via INTRAVENOUS
  Filled 2015-09-30: qty 10

## 2015-09-30 MED ORDER — SODIUM CHLORIDE 0.9 % IJ SOLN
INTRAMUSCULAR | Status: AC
  Filled 2015-09-30: qty 50

## 2015-09-30 MED ORDER — METOCLOPRAMIDE HCL 5 MG PO TABS: 5.0000 mg | ORAL_TABLET | Freq: Three times a day (TID) | ORAL | Status: DC | PRN

## 2015-09-30 MED ORDER — HYDROGEN PEROXIDE 3 % EX SOLN
CUTANEOUS | Status: DC | PRN
  Administered 2015-09-30: 1 via TOPICAL

## 2015-09-30 MED ORDER — ACETAMINOPHEN 10 MG/ML IV SOLN
INTRAVENOUS | Status: AC
  Filled 2015-09-30: qty 100

## 2015-09-30 MED ORDER — SORBITOL 70 % SOLN
30.0000 mL | Freq: Every day | Status: DC | PRN
  Filled 2015-09-30: qty 30

## 2015-09-30 MED ORDER — BUPIVACAINE-EPINEPHRINE (PF) 0.25% -1:200000 IJ SOLN
INTRAMUSCULAR | Status: AC
  Filled 2015-09-30: qty 30

## 2015-09-30 MED ORDER — ONDANSETRON HCL 4 MG PO TABS: 4.0000 mg | ORAL_TABLET | Freq: Four times a day (QID) | ORAL | Status: DC | PRN

## 2015-09-30 MED ORDER — PROPOFOL 10 MG/ML IV BOLUS
INTRAVENOUS | Status: AC
  Filled 2015-09-30: qty 20

## 2015-09-30 MED ORDER — FENTANYL CITRATE (PF) 100 MCG/2ML IJ SOLN
INTRAMUSCULAR | Status: AC
  Filled 2015-09-30: qty 2

## 2015-09-30 MED ORDER — LACTATED RINGERS IV SOLN
INTRAVENOUS | Status: DC
  Administered 2015-09-30 (×3): via INTRAVENOUS

## 2015-09-30 MED ORDER — POVIDONE-IODINE 10 % EX SWAB: 2.0000 "application " | Freq: Once | CUTANEOUS | Status: DC

## 2015-09-30 MED ORDER — HYDROMORPHONE HCL 1 MG/ML IJ SOLN
0.2500 mg | INTRAMUSCULAR | Status: DC | PRN
  Administered 2015-09-30 (×5): 0.5 mg via INTRAVENOUS

## 2015-09-30 MED ORDER — LACTATED RINGERS IV SOLN: INTRAVENOUS | Status: DC

## 2015-09-30 MED ORDER — CEFAZOLIN SODIUM-DEXTROSE 2-4 GM/100ML-% IV SOLN
2.0000 g | Freq: Four times a day (QID) | INTRAVENOUS | Status: AC
  Administered 2015-09-30 – 2015-10-01 (×2): 2 g via INTRAVENOUS
  Filled 2015-09-30 (×2): qty 100

## 2015-09-30 MED ORDER — METHOCARBAMOL 500 MG PO TABS
500.0000 mg | ORAL_TABLET | Freq: Four times a day (QID) | ORAL | Status: DC | PRN
  Filled 2015-09-30: qty 1

## 2015-09-30 MED ORDER — HYDROMORPHONE HCL 1 MG/ML IJ SOLN
0.5000 mg | INTRAMUSCULAR | Status: DC | PRN
Start: 2015-09-30 — End: 2015-10-02
  Administered 2015-09-30 – 2015-10-01 (×3): 1 mg via INTRAVENOUS
  Filled 2015-09-30 (×4): qty 1

## 2015-09-30 MED ORDER — DEXAMETHASONE SODIUM PHOSPHATE 10 MG/ML IJ SOLN
10.0000 mg | Freq: Once | INTRAMUSCULAR | Status: AC
  Administered 2015-10-01: 10 mg via INTRAVENOUS
  Filled 2015-09-30: qty 1

## 2015-09-30 MED ORDER — SODIUM CHLORIDE 0.9 % IR SOLN
Status: DC | PRN
  Administered 2015-09-30: 3000 mL

## 2015-09-30 MED ORDER — HYDROMORPHONE HCL 1 MG/ML IJ SOLN
INTRAMUSCULAR | Status: AC
  Administered 2015-09-30: 0.5 mg via INTRAVENOUS
  Filled 2015-09-30: qty 1

## 2015-09-30 MED ORDER — BUPIVACAINE HCL (PF) 0.5 % IJ SOLN
INTRAMUSCULAR | Status: DC | PRN
  Administered 2015-09-30: 3 mL via INTRATHECAL

## 2015-09-30 MED ORDER — MIDAZOLAM HCL 2 MG/2ML IJ SOLN
INTRAMUSCULAR | Status: AC
  Filled 2015-09-30: qty 2

## 2015-09-30 MED ORDER — PROPOFOL 10 MG/ML IV BOLUS
INTRAVENOUS | Status: AC
  Filled 2015-09-30: qty 40

## 2015-09-30 MED ORDER — MAGNESIUM CITRATE PO SOLN
1.0000 | Freq: Once | ORAL | Status: DC | PRN
Start: 2015-09-30 — End: 2015-10-02

## 2015-09-30 MED ORDER — PROPOFOL 10 MG/ML IV BOLUS
INTRAVENOUS | Status: DC | PRN
  Administered 2015-09-30: 30 mg via INTRAVENOUS

## 2015-09-30 MED ORDER — ASPIRIN EC 81 MG PO TBEC
81.0000 mg | DELAYED_RELEASE_TABLET | Freq: Two times a day (BID) | ORAL | Status: DC
  Administered 2015-10-01 – 2015-10-02 (×2): 81 mg via ORAL
  Filled 2015-09-30 (×2): qty 1

## 2015-09-30 MED ORDER — ISOPROPYL ALCOHOL 70 % SOLN
Status: AC
  Filled 2015-09-30: qty 480

## 2015-09-30 MED ORDER — SENNA 8.6 MG PO TABS
2.0000 | ORAL_TABLET | Freq: Every day | ORAL | Status: DC
  Administered 2015-09-30 – 2015-10-01 (×2): 17.2 mg via ORAL
  Filled 2015-09-30 (×2): qty 2

## 2015-09-30 MED ORDER — TRANEXAMIC ACID 1000 MG/10ML IV SOLN
1000.0000 mg | INTRAVENOUS | Status: DC
  Filled 2015-09-30: qty 10

## 2015-09-30 MED ORDER — DEXTROSE 5 % IV SOLN
500.0000 mg | Freq: Four times a day (QID) | INTRAVENOUS | Status: DC | PRN
  Administered 2015-09-30: 500 mg via INTRAVENOUS
  Filled 2015-09-30: qty 550
  Filled 2015-09-30: qty 5

## 2015-09-30 MED ORDER — ONDANSETRON HCL 4 MG/2ML IJ SOLN
4.0000 mg | Freq: Four times a day (QID) | INTRAMUSCULAR | Status: DC | PRN
  Administered 2015-09-30 – 2015-10-01 (×2): 4 mg via INTRAVENOUS
  Filled 2015-09-30 (×2): qty 2

## 2015-09-30 MED ORDER — ISOPROPYL ALCOHOL 70 % SOLN
Status: DC | PRN
  Administered 2015-09-30: 1 via TOPICAL

## 2015-09-30 MED ORDER — SODIUM CHLORIDE 0.9 % IJ SOLN
INTRAMUSCULAR | Status: DC | PRN
  Administered 2015-09-30: 30 mL

## 2015-09-30 MED ORDER — ACETAMINOPHEN 325 MG PO TABS: 650.0000 mg | ORAL_TABLET | Freq: Four times a day (QID) | ORAL | Status: DC | PRN

## 2015-09-30 MED ORDER — CEFAZOLIN SODIUM-DEXTROSE 2-4 GM/100ML-% IV SOLN
INTRAVENOUS | Status: AC
  Filled 2015-09-30: qty 100

## 2015-09-30 MED ORDER — ACETAMINOPHEN 10 MG/ML IV SOLN
1000.0000 mg | INTRAVENOUS | Status: AC
  Administered 2015-09-30: 1000 mg via INTRAVENOUS
  Filled 2015-09-30: qty 100

## 2015-09-30 MED ORDER — DOCUSATE SODIUM 100 MG PO CAPS
100.0000 mg | ORAL_CAPSULE | Freq: Two times a day (BID) | ORAL | Status: DC
  Administered 2015-09-30 – 2015-10-02 (×3): 100 mg via ORAL
  Filled 2015-09-30 (×3): qty 1

## 2015-09-30 MED ORDER — CEFAZOLIN SODIUM-DEXTROSE 2-4 GM/100ML-% IV SOLN
2.0000 g | INTRAVENOUS | Status: AC
  Administered 2015-09-30: 2 g via INTRAVENOUS
  Filled 2015-09-30: qty 100

## 2015-09-30 MED ORDER — WATER FOR IRRIGATION, STERILE IR SOLN
Status: DC | PRN
  Administered 2015-09-30: 1000 mL via SURGICAL_CAVITY

## 2015-09-30 MED ORDER — ACETAMINOPHEN 650 MG RE SUPP: 650.0000 mg | Freq: Four times a day (QID) | RECTAL | Status: DC | PRN

## 2015-09-30 MED ORDER — PROPOFOL 500 MG/50ML IV EMUL
INTRAVENOUS | Status: DC | PRN
  Administered 2015-09-30: 75 ug/kg/min via INTRAVENOUS

## 2015-09-30 MED ORDER — PHENYLEPHRINE HCL 10 MG/ML IJ SOLN
INTRAMUSCULAR | Status: DC | PRN
  Administered 2015-09-30: 80 ug via INTRAVENOUS

## 2015-09-30 MED ORDER — HYDROMORPHONE HCL 1 MG/ML IJ SOLN
0.2500 mg | INTRAMUSCULAR | Status: DC | PRN
  Administered 2015-09-30: 0.5 mg via INTRAVENOUS

## 2015-09-30 MED ORDER — 0.9 % SODIUM CHLORIDE (POUR BTL) OPTIME
TOPICAL | Status: DC | PRN
  Administered 2015-09-30: 2000 mL

## 2015-09-30 MED ORDER — KETOROLAC TROMETHAMINE 30 MG/ML IJ SOLN
INTRAMUSCULAR | Status: AC
  Filled 2015-09-30: qty 1

## 2015-09-30 MED ORDER — PHENOL 1.4 % MT LIQD: 1.0000 | OROMUCOSAL | Status: DC | PRN

## 2015-09-30 MED ORDER — FENTANYL CITRATE (PF) 100 MCG/2ML IJ SOLN
INTRAMUSCULAR | Status: DC | PRN
  Administered 2015-09-30: 100 ug via INTRAVENOUS

## 2015-09-30 MED ORDER — MIDAZOLAM HCL 5 MG/5ML IJ SOLN
INTRAMUSCULAR | Status: DC | PRN
  Administered 2015-09-30: 2 mg via INTRAVENOUS

## 2015-09-30 MED ORDER — BUPIVACAINE-EPINEPHRINE 0.25% -1:200000 IJ SOLN
INTRAMUSCULAR | Status: DC | PRN
  Administered 2015-09-30: 30 mL

## 2015-09-30 SURGICAL SUPPLY — 47 items
BAG DECANTER FOR FLEXI CONT (MISCELLANEOUS) IMPLANT
BAG SPEC THK2 15X12 ZIP CLS (MISCELLANEOUS)
BAG ZIPLOCK 12X15 (MISCELLANEOUS) IMPLANT
BNDG COHESIVE 4X5 TAN STRL (GAUZE/BANDAGES/DRESSINGS) ×1 IMPLANT
CAPT HIP TOTAL 2 ×1 IMPLANT
CHLORAPREP W/TINT 26ML (MISCELLANEOUS) ×2 IMPLANT
CLOTH BEACON ORANGE TIMEOUT ST (SAFETY) ×2 IMPLANT
COVER PERINEAL POST (MISCELLANEOUS) ×2 IMPLANT
DECANTER SPIKE VIAL GLASS SM (MISCELLANEOUS) ×3 IMPLANT
DRAPE LG THREE QUARTER DISP (DRAPES) ×4 IMPLANT
DRAPE STERI IOBAN 125X83 (DRAPES) ×2 IMPLANT
DRAPE U-SHAPE 47X51 STRL (DRAPES) ×4 IMPLANT
DRSG AQUACEL AG ADV 3.5X10 (GAUZE/BANDAGES/DRESSINGS) ×2 IMPLANT
DRSG AQUACEL AG ADV 3.5X14 (GAUZE/BANDAGES/DRESSINGS) ×1 IMPLANT
ELECT PENCIL ROCKER SW 15FT (MISCELLANEOUS) ×1 IMPLANT
ELECT REM PT RETURN 15FT ADLT (MISCELLANEOUS) ×2 IMPLANT
GAUZE SPONGE 4X4 12PLY STRL (GAUZE/BANDAGES/DRESSINGS) ×2 IMPLANT
GLOVE BIO SURGEON STRL SZ8.5 (GLOVE) ×5 IMPLANT
GLOVE BIOGEL PI IND STRL 8.5 (GLOVE) ×1 IMPLANT
GLOVE BIOGEL PI INDICATOR 8.5 (GLOVE) ×2
GOWN SPEC L3 XXLG W/TWL (GOWN DISPOSABLE) ×3 IMPLANT
HANDPIECE INTERPULSE COAX TIP (DISPOSABLE) ×2
HOLDER FOLEY CATH W/STRAP (MISCELLANEOUS) ×2 IMPLANT
HOOD PEEL AWAY FLYTE STAYCOOL (MISCELLANEOUS) ×4 IMPLANT
LIQUID BAND (GAUZE/BANDAGES/DRESSINGS) ×4 IMPLANT
MARKER SKIN DUAL TIP RULER LAB (MISCELLANEOUS) ×2 IMPLANT
NDL SAFETY ECLIPSE 18X1.5 (NEEDLE) IMPLANT
NDL SPNL 18GX3.5 QUINCKE PK (NEEDLE) ×1 IMPLANT
NEEDLE HYPO 18GX1.5 SHARP (NEEDLE) ×2
NEEDLE SPNL 18GX3.5 QUINCKE PK (NEEDLE) ×2 IMPLANT
PACK ANTERIOR HIP CUSTOM (KITS) ×2 IMPLANT
SAW OSC TIP CART 19.5X105X1.3 (SAW) ×2 IMPLANT
SEALER BIPOLAR AQUA 6.0 (INSTRUMENTS) ×2 IMPLANT
SET HNDPC FAN SPRY TIP SCT (DISPOSABLE) ×1 IMPLANT
SOL PREP POV-IOD 4OZ 10% (MISCELLANEOUS) ×2 IMPLANT
SUT ETHIBOND NAB CT1 #1 30IN (SUTURE) ×4 IMPLANT
SUT MNCRL AB 3-0 PS2 18 (SUTURE) ×2 IMPLANT
SUT MON AB 2-0 CT1 36 (SUTURE) ×4 IMPLANT
SUT VIC AB 1 CT1 36 (SUTURE) ×2 IMPLANT
SUT VIC AB 2-0 CT1 27 (SUTURE) ×2
SUT VIC AB 2-0 CT1 TAPERPNT 27 (SUTURE) ×1 IMPLANT
SUT VLOC 180 0 24IN GS25 (SUTURE) ×2 IMPLANT
SYR 20CC LL (SYRINGE) ×1 IMPLANT
SYR 50ML LL SCALE MARK (SYRINGE) ×4 IMPLANT
TRAY FOLEY W/METER SILVER 16FR (SET/KITS/TRAYS/PACK) ×1 IMPLANT
WATER STERILE IRR 1500ML POUR (IV SOLUTION) ×3 IMPLANT
YANKAUER SUCT BULB TIP 10FT TU (MISCELLANEOUS) ×2 IMPLANT

## 2015-09-30 NOTE — H&P (Signed)
H&P update.   No changes. Procedure reviewed with patient who wishes to proceed.

## 2015-09-30 NOTE — Anesthesia Preprocedure Evaluation (Addendum)
Anesthesia Evaluation  Patient identified by MRN, date of birth, ID band Patient awake    Reviewed: Allergy & Precautions, H&P , NPO status , Patient's Chart, lab work & pertinent test results  Airway Mallampati: II  TM Distance: >3 FB Neck ROM: full    Dental no notable dental hx. (+) Dental Advisory Given, Teeth Intact   Pulmonary neg pulmonary ROS,    Pulmonary exam normal breath sounds clear to auscultation       Cardiovascular Exercise Tolerance: Good negative cardio ROS Normal cardiovascular exam Rhythm:regular Rate:Normal     Neuro/Psych negative neurological ROS  negative psych ROS   GI/Hepatic negative GI ROS, Neg liver ROS,   Endo/Other  negative endocrine ROS  Renal/GU negative Renal ROS  negative genitourinary   Musculoskeletal   Abdominal (+) + obese,   Peds  Hematology negative hematology ROS (+)   Anesthesia Other Findings   Reproductive/Obstetrics negative OB ROS                             Anesthesia Physical Anesthesia Plan  ASA: II  Anesthesia Plan: Spinal   Post-op Pain Management:    Induction:   Airway Management Planned:   Additional Equipment:   Intra-op Plan:   Post-operative Plan:   Informed Consent: I have reviewed the patients History and Physical, chart, labs and discussed the procedure including the risks, benefits and alternatives for the proposed anesthesia with the patient or authorized representative who has indicated his/her understanding and acceptance.   Dental Advisory Given  Plan Discussed with: CRNA  Anesthesia Plan Comments:        Anesthesia Quick Evaluation

## 2015-09-30 NOTE — Op Note (Signed)
OPERATIVE REPORT  SURGEON: Samson FredericBrian Jamar Weatherall, MD   ASSISTANT: Skip MayerBlair Roberts, PA-C.  PREOPERATIVE DIAGNOSIS: Right hip avascular necrosis with collapse.   POSTOPERATIVE DIAGNOSIS: Right hip avascular necrosis with collapse.   PROCEDURE: Right total hip arthroplasty, anterior approach.   IMPLANTS: DePuy Tri Lock stem, size 6, std offset. DePuy Pinnacle Cup, size 56 mm. DePuy Altrx liner, size 36 by 56 mm, +4 neutral. DePuy Biolox ceramic head ball, size 36 + 5 mm.  ANESTHESIA:  Spinal  ESTIMATED BLOOD LOSS: 400 mL.  ANTIBIOTICS: 2 g Ancef.  DRAINS: None.  COMPLICATIONS: None.   CONDITION: PACU - hemodynamically stable.Marland Kitchen.   BRIEF CLINICAL NOTE: Cole Richardson is a 40 y.o. male with a long-standing history of Right hip avascular necrosis with collapse. After failing conservative management, the patient was indicated for total hip arthroplasty. The risks, benefits, and alternatives to the procedure were explained, and the patient elected to proceed.  PROCEDURE IN DETAIL: Surgical site was marked by myself. Spinal anesthesia was obtained in the pre-op holding area. Once inside the operative room, a foley catheter was inserted. The patient was then positioned on the Hana table. All bony prominences were well padded. The hip was prepped and draped in the normal sterile surgical fashion. A time-out was called verifying side and site of surgery. The patient received IV antibiotics within 60 minutes of beginning the procedure.  The direct anterior approach to the hip was performed through the Hueter interval. Lateral femoral circumflex vessels were treated with the Auqumantys. The anterior capsule was exposed and an inverted T capsulotomy was made.The femoral neck cut was made to the level of the templated cut. A corkscrew was placed into the head and the head was removed. The femoral head was found to have delaminated cartilage. The head was passed to the back table and was  measured.  Acetabular exposure was achieved, and the pulvinar and labrum were excised. Sequental reaming of the acetabulum was then performed up to a size 55 mm reamer. A 56 mm cup was then opened and impacted into place at approximately 40 degrees of abduction and 20 degrees of anteversion. The final polyethylene liner was impacted into place and acetabular osteophytes were removed.   I then gained femoral exposure taking care to protect the abductors and greater trochanter. This was performed using standard external rotation, extension, and adduction. The capsule was peeled off the inner aspect of the greater trochanter, taking care to preserve the short external rotators. A cookie cutter was used to enter the femoral canal, and then the femoral canal finder was placed. Sequential broaching was performed up to a size 6. Calcar planer was used on the femoral neck remnant. I placed a std offset neck and a trial head ball. The hip was reduced. Leg lengths and offset were checked fluoroscopically. The hip was dislocated and trial components were removed. The final implants were placed, and the hip was reduced.  Fluoroscopy was used to confirm component position and leg lengths. At 90 degrees of external rotation and full extension, the hip was stable to an anterior directed force.  The wound was copiously irrigated with a dilute betadine solution followed by normal saline. Marcaine solution was injected into the periarticular soft tissue. The wound was closed in layers using #1 Vicryl and V-Loc for the fascia, 2-0 Vicryl for the subcutaneous fat, 2-0 Monocryl for the deep dermal layer, 3-0 running Monocryl subcuticular stitch, and Dermabond for the skin. Once the glue was fully dried, an Aquacell Ag  dressing was applied. The patient was transported to the recovery room in stable condition. Sponge, needle, and instrument counts were correct at the end of the case x2. The patient tolerated the  procedure well and there were no known complications.  Please note that a surgical assistant was a medical necessity for this procedure to perform it in a safe and expeditious manner. Assistant was necessary to provide appropriate retraction of vital neurovascular structures, to prevent femoral fracture, and to allow for anatomic placement of the prosthesis.

## 2015-09-30 NOTE — Discharge Instructions (Signed)
°Dr. Lucindia Lemley °Joint Replacement Specialist °[facility] °[address] °[place], [place] [address] °[phone] ° ° °TOTAL HIP REPLACEMENT POSTOPERATIVE DIRECTIONS ° ° ° °Hip Rehabilitation, Guidelines Following Surgery  ° °WEIGHT BEARING °Weight bearing as tolerated with assist device (walker, cane, etc) as directed, use it as long as suggested by your surgeon or therapist, typically at least 4-6 weeks. ° °The results of a hip operation are greatly improved after range of motion and muscle strengthening exercises. Follow all safety measures which are given to protect your hip. If any of these exercises cause increased pain or swelling in your joint, decrease the amount until you are comfortable again. Then slowly increase the exercises. Call your caregiver if you have problems or questions.  ° °HOME CARE INSTRUCTIONS  °Most of the following instructions are designed to prevent the dislocation of your new hip.  °Remove items at home which could result in a fall. This includes throw rugs or furniture in walking pathways.  °Continue medications as instructed at time of discharge. °· You may have some home medications which will be placed on hold until you complete the course of blood thinner medication. °· You may start showering once you are discharged home. Do not remove your dressing. °Do not put on socks or shoes without following the instructions of your caregivers.   °Sit on chairs with arms. Use the chair arms to help push yourself up when arising.  °Arrange for the use of a toilet seat elevator so you are not sitting low.  °· Walk with walker as instructed.  °You may resume a sexual relationship in one month or when given the OK by your caregiver.  °Use walker as long as suggested by your caregivers.  °You may put full weight on your legs and walk as much as is comfortable. °Avoid periods of inactivity such as sitting longer than an hour when not asleep. This helps prevent  blood clots.  °You may return to work once you are cleared by your surgeon.  °Do not drive a car for 6 weeks or until released by your surgeon.  °Do not drive while taking narcotics.  °Wear elastic stockings for two weeks following surgery during the day but you may remove then at night.  °Make sure you keep all of your appointments after your operation with all of your doctors and caregivers. You should call the office at the above phone number and make an appointment for approximately two weeks after the date of your surgery. °Please pick up a stool softener and laxative for home use as long as you are requiring pain medications. °· ICE to the affected hip every three hours for 30 minutes at a time and then as needed for pain and swelling. Continue to use ice on the hip for pain and swelling from surgery. You may notice swelling that will progress down to the foot and ankle.  This is normal after surgery.  Elevate the leg when you are not up walking on it.   °It is important for you to complete the blood thinner medication as prescribed by your doctor. °· Continue to use the breathing machine which will help keep your temperature down.  It is common for your temperature to cycle up and down following surgery, especially at night when you are not up moving around and exerting yourself.  The breathing machine keeps your lungs expanded and your temperature down. ° °RANGE OF MOTION AND STRENGTHENING EXERCISES  °These exercises are   designed to help you keep full movement of your hip joint. Follow your caregiver's or physical therapist's instructions. Perform all exercises about fifteen times, three times per day or as directed. Exercise both hips, even if you have had only one joint replacement. These exercises can be done on a training (exercise) mat, on the floor, on a table or on a bed. Use whatever works the best and is most comfortable for you. Use music or television while you are exercising so that the exercises  are a pleasant break in your day. This will make your life better with the exercises acting as a break in routine you can look forward to.  °Lying on your back, slowly slide your foot toward your buttocks, raising your knee up off the floor. Then slowly slide your foot back down until your leg is straight again.  °Lying on your back spread your legs as far apart as you can without causing discomfort.  °Lying on your side, raise your upper leg and foot straight up from the floor as far as is comfortable. Slowly lower the leg and repeat.  °Lying on your back, tighten up the muscle in the front of your thigh (quadriceps muscles). You can do this by keeping your leg straight and trying to raise your heel off the floor. This helps strengthen the largest muscle supporting your knee.  °Lying on your back, tighten up the muscles of your buttocks both with the legs straight and with the knee bent at a comfortable angle while keeping your heel on the floor.  ° °SKILLED REHAB INSTRUCTIONS: °If the patient is transferred to a skilled rehab facility following release from the hospital, a list of the current medications will be sent to the facility for the patient to continue.  When discharged from the skilled rehab facility, please have the facility set up the patient's Home Health Physical Therapy prior to being released. Also, the skilled facility will be responsible for providing the patient with their medications at time of release from the facility to include their pain medication and their blood thinner medication. If the patient is still at the rehab facility at time of the two week follow up appointment, the skilled rehab facility will also need to assist the patient in arranging follow up appointment in our office and any transportation needs. ° °MAKE SURE YOU:  °Understand these instructions.  °Will watch your condition.  °Will get help right away if you are not doing well or get worse. ° °Pick up stool softner and  laxative for home use following surgery while on pain medications. °Do not remove your dressing. °The dressing is waterproof--it is OK to take showers. °Continue to use ice for pain and swelling after surgery. °Do not use any lotions or creams on the incision until instructed by your surgeon. °Total Hip Protocol. ° ° °

## 2015-09-30 NOTE — Anesthesia Postprocedure Evaluation (Signed)
Anesthesia Post Note  Patient: Cole Richardson  Procedure(s) Performed: Procedure(s) (LRB): RIGHT TOTAL HIP ARTHROPLASTY ANTERIOR APPROACH (Right)  Patient location during evaluation: PACU Anesthesia Type: Spinal Level of consciousness: awake and alert Pain management: pain level controlled Vital Signs Assessment: post-procedure vital signs reviewed and stable Respiratory status: spontaneous breathing, nonlabored ventilation, respiratory function stable and patient connected to nasal cannula oxygen Cardiovascular status: blood pressure returned to baseline and stable Postop Assessment: no signs of nausea or vomiting Anesthetic complications: no    Last Vitals:  Filed Vitals:   09/30/15 2045 09/30/15 2100  BP: 110/73   Pulse: 72   Temp:  36.8 C  Resp: 13     Last Pain:  Filed Vitals:   09/30/15 2102  PainSc: 7                  October Peery L

## 2015-09-30 NOTE — Transfer of Care (Signed)
Immediate Anesthesia Transfer of Care Note  Patient: Cole Richardson  Procedure(s) Performed: Procedure(s) with comments: RIGHT TOTAL HIP ARTHROPLASTY ANTERIOR APPROACH (Right) - Requesting RNFA  Patient Location: PACU  Anesthesia Type:Spinal  Level of Consciousness: sedated, patient cooperative and responds to stimulation  Airway & Oxygen Therapy: Patient Spontanous Breathing and Patient connected to face mask oxygen  Post-op Assessment: Report given to RN and Post -op Vital signs reviewed and stable  Post vital signs: Reviewed and stable  Last Vitals:  Filed Vitals:   09/30/15 1405  BP: 122/75  Pulse: 83  Temp: 37 C  Resp: 16    Last Pain: There were no vitals filed for this visit.       Complications: No apparent anesthesia complications

## 2015-09-30 NOTE — Anesthesia Procedure Notes (Addendum)
Spinal Patient location during procedure: OR Start time: 09/30/2015 4:20 PM End time: 09/30/2015 4:35 PM Staffing Anesthesiologist: Rod Mae Performed by: anesthesiologist  Preanesthetic Checklist Completed: patient identified, site marked, surgical consent, pre-op evaluation, timeout performed, IV checked, risks and benefits discussed and monitors and equipment checked Spinal Block Patient position: sitting Prep: Betadine Patient monitoring: heart rate, continuous pulse ox and blood pressure Approach: left paramedian Location: L3-4 Injection technique: single-shot Needle Needle type: Sprotte  Needle gauge: 24 G Needle length: 9 cm Assessment Sensory level: T6 Additional Notes Expiration date of kit checked and confirmed. Patient tolerated procedure well, without complications.

## 2015-10-01 ENCOUNTER — Encounter (HOSPITAL_COMMUNITY): Payer: Self-pay | Admitting: Orthopedic Surgery

## 2015-10-01 LAB — BASIC METABOLIC PANEL
ANION GAP: 7 (ref 5–15)
BUN: 11 mg/dL (ref 6–20)
CALCIUM: 8.2 mg/dL — AB (ref 8.9–10.3)
CO2: 24 mmol/L (ref 22–32)
Chloride: 105 mmol/L (ref 101–111)
Creatinine, Ser: 0.86 mg/dL (ref 0.61–1.24)
GFR calc non Af Amer: >60 mL/min (ref >60)
Glucose, Bld: 130 mg/dL — ABNORMAL HIGH (ref 65–99)
Potassium: 4 mmol/L (ref 3.5–5.1)
Sodium: 136 mmol/L (ref 135–145)

## 2015-10-01 LAB — CBC
HEMATOCRIT: 38.6 % — AB (ref 39.0–52.0)
HEMOGLOBIN: 12.9 g/dL — AB (ref 13.0–17.0)
MCH: 29.3 pg (ref 26.0–34.0)
MCHC: 33.4 g/dL (ref 30.0–36.0)
MCV: 87.7 fL (ref 78.0–100.0)
Platelets: 206 10*3/uL (ref 150–400)
RBC: 4.4 MIL/uL (ref 4.22–5.81)
RDW: 13.1 % (ref 11.5–15.5)
WBC: 16.6 10*3/uL — AB (ref 4.0–10.5)

## 2015-10-01 MED ORDER — DOCUSATE SODIUM 100 MG PO CAPS: 100.0000 mg | ORAL_CAPSULE | Freq: Two times a day (BID) | ORAL | Status: DC

## 2015-10-01 MED ORDER — METOCLOPRAMIDE HCL 5 MG/ML IJ SOLN
5.0000 mg | Freq: Once | INTRAMUSCULAR | Status: AC
  Administered 2015-10-01: 5 mg via INTRAVENOUS

## 2015-10-01 MED ORDER — PROMETHAZINE HCL 25 MG PO TABS: 12.5000 mg | ORAL_TABLET | Freq: Four times a day (QID) | ORAL | Status: DC | PRN

## 2015-10-01 MED ORDER — ONDANSETRON HCL 4 MG PO TABS: 4.0000 mg | ORAL_TABLET | Freq: Four times a day (QID) | ORAL | Status: DC | PRN

## 2015-10-01 MED ORDER — SENNA 8.6 MG PO TABS: 2.0000 | ORAL_TABLET | Freq: Every day | ORAL | Status: DC

## 2015-10-01 MED ORDER — PROMETHAZINE HCL 25 MG/ML IJ SOLN
6.2500 mg | Freq: Four times a day (QID) | INTRAMUSCULAR | Status: DC | PRN
  Administered 2015-10-01: 12.5 mg via INTRAVENOUS
  Filled 2015-10-01: qty 1

## 2015-10-01 MED ORDER — HYDROCODONE-ACETAMINOPHEN 5-325 MG PO TABS: 1.0000 | ORAL_TABLET | ORAL | Status: DC | PRN

## 2015-10-01 MED ORDER — ASPIRIN 81 MG PO TABS: 81.0000 mg | ORAL_TABLET | Freq: Two times a day (BID) | ORAL | Status: DC

## 2015-10-01 MED ORDER — HYDROMORPHONE HCL 2 MG PO TABS
1.0000 mg | ORAL_TABLET | ORAL | Status: DC | PRN
  Administered 2015-10-01 – 2015-10-02 (×8): 2 mg via ORAL
  Filled 2015-10-01 (×8): qty 1

## 2015-10-01 MED ORDER — HYDROMORPHONE HCL 2 MG PO TABS: 1.0000 mg | ORAL_TABLET | ORAL | Status: DC | PRN

## 2015-10-01 NOTE — Evaluation (Signed)
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  Pt should progress to dc home with family assist and HHPT follow up    Follow Up Recommendations Home health PT    Equipment Recommendations  Rolling walker with 5" wheels    Recommendations for Other Services OT consult     Precautions / Restrictions Precautions Precautions: Fall Restrictions Weight Bearing Restrictions: No Other Position/Activity Restrictions: WBAT      Mobility  Bed Mobility Overal bed mobility: Needs Assistance;+2 for physical assistance;+ 2 for safety/equipment Bed Mobility: Supine to Sit     Supine to sit: Min assist;+2 for physical assistance;+2 for safety/equipment     General bed mobility comments: cues for sequence and use of L LE to self assist  Transfers Overall transfer level: Needs assistance Equipment used: Rolling walker (2 wheeled) Transfers: Sit to/from Stand Sit to Stand: Min assist;+2 physical assistance;From elevated surface;+2 safety/equipment         General transfer comment: cues for LE management and use of UEs to self assist  Ambulation/Gait Ambulation/Gait assistance: Min assist;+2 safety/equipment;+2 physical assistance Ambulation Distance (Feet): 18 Feet Assistive device: Rolling walker (2 wheeled) Gait Pattern/deviations: Step-to pattern;Decreased step length - right;Decreased step length - left;Shuffle;Trunk flexed Gait velocity: decr Gait velocity interpretation: Below normal speed for age/gender General Gait Details: cues for sequence, posture and position from [employer]  Stairs            Wheelchair Mobility    Modified Rankin (Stroke Patients Only)       Balance                                              Pertinent Vitals/Pain Pain Assessment: 0-10 Pain Score: 7  Pain Location: L hip Pain Descriptors / Indicators: Aching;Burning;Sore Pain Intervention(s): Limited activity within patient's tolerance;Monitored during session;Premedicated before session;Ice applied    Home Living Family/patient expects to be discharged to:: Private residence Living Arrangements: Spouse/significant other Available Help at Discharge: Family Type of Home: Apartment Home Access: Stairs to enter Entrance Stairs-Rails: Doctor, general practiceight;Left Entrance Stairs-Number of Steps: 16 Home Layout: One level Home Equipment: None      Prior Function Level of Independence: Independent               Hand Dominance        Extremity/Trunk Assessment   Upper Extremity Assessment: RUE deficits/detail RUE Deficits / Details: Pt reports "I cant feel my fingers" but motor function appears intact.  Physician aware         Lower Extremity Assessment: RLE deficits/detail RLE Deficits / Details: Pain ltd with muscle guarding to 50 hip flex and 10 abd.  Strength at hip 2-/5    Cervical / Trunk Assessment: Normal  Communication   Communication: No difficulties  Cognition Arousal/Alertness: Awake/alert Behavior During Therapy: WFL for tasks assessed/performed Overall Cognitive Status: Within Functional Limits for tasks assessed                      General Comments      Exercises Total Joint Exercises Ankle Circles/Pumps: AROM;Both;15 reps;Supine Quad Sets:  AROM;Both;10 reps;Supine Heel Slides: AAROM;Right;15 reps;Supine Hip ABduction/ADduction: AAROM;Right;10 reps;Supine      Assessment/Plan    PT Assessment Patient needs continued PT services  PT Diagnosis Difficulty walking   PT Problem List Decreased strength;Decreased range of motion;Decreased activity tolerance;Decreased mobility;Decreased knowledge of use of DME;Pain  PT Treatment  Interventions DME instruction;Gait training;Stair training;Functional mobility training;Therapeutic activities;Therapeutic exercise;Patient/family education   PT Goals (Current goals can be found in the Care Plan section) Acute Rehab PT Goals Patient Stated Goal: Regain IND PT Goal Formulation: With patient Time For Goal Achievement: 10/05/15 Potential to Achieve Goals: Good    Frequency 7X/week   Barriers to discharge        Co-evaluation               End of Session Equipment Utilized During Treatment: Gait belt Activity Tolerance: Patient limited by pain Patient left: in chair;with call bell/phone within reach;with family/visitor present Nurse Communication: Mobility status         Time: 1610-9604 PT Time Calculation (min) (ACUTE ONLY): 40 min   Charges:   PT Evaluation $PT Eval Low Complexity: 1 Procedure PT Treatments $Gait Training: 8-22 mins   PT G Codes:        Caiden Monsivais 27-Oct-2015, 4:07 PM

## 2015-10-01 NOTE — Progress Notes (Signed)
OT Cancellation Note  Patient Details Name: Cole Richardson MRN: [id number] DOB: 03/26/1976   Cancelled Treatment:    Reason Eval/Treat Not Completed: Other (comment).  Pt has had pain control issues and has not been seen by PT yet. Will check back later today or early in am.  Cole Richardson 10/01/2015, 2:40 PM  Cole Richardson, OTR/L [phone] 10/01/2015

## 2015-10-01 NOTE — Discharge Summary (Signed)
Physician Discharge Summary  Patient ID: RAYMON SCHLARB MRN: [id number] DOB/AGE: [birth year] 42 y.o.  Admit date: 09/30/2015 Discharge date: 10/02/2015  Admission Diagnoses:  Avascular necrosis of hip [facility])  Discharge Diagnoses:  Principal Problem:   Avascular necrosis of hip [facility])   Past Medical History  Diagnosis Date  . Minimal change disease     Surgeries: Procedure(s): RIGHT TOTAL HIP ARTHROPLASTY ANTERIOR APPROACH on 09/30/2015   Consultants (if any):    Discharged Condition: Improved  Hospital Course: KAMAURI DENARDO is an 39 y.o. male who was admitted 09/30/2015 with a diagnosis of Avascular necrosis of hip (HCC) and went to the operating room on 09/30/2015 and underwent the above named procedures.    He was given perioperative antibiotics:      Anti-infectives    Start     Dose/Rate Route Frequency Ordered Stop   09/30/15 2300  ceFAZolin (ANCEF) IVPB 2g/100 mL premix     2 g 200 mL/hr over 30 Minutes Intravenous Every 6 hours 09/30/15 2112 10/01/15 0619   09/30/15 1415  ceFAZolin (ANCEF) IVPB 2g/100 mL premix     2 g 200 mL/hr over 30 Minutes Intravenous On call to O.R. 09/30/15 1404 09/30/15 1656    . He developed N/V on the night of POD#0, which responded to antiemetics. Tolerated PO diet well. He developed paresthsias of both hands postoperatively not consistent with acute carpal tunnel syndrome, which we observed and has improved.  He was given sequential compression devices, early ambulation, and ASA for DVT prophylaxis.  He benefited maximally from the hospital stay and there were no complications.    Recent vital signs:  Filed Vitals:   10/01/15 2239 10/02/15 0617  BP: 128/69 114/60  Pulse: 93 74  Temp: 98.8 F (37.1 C) 98.8 F (37.1 C)  Resp: 16 14    Recent laboratory studies:  Lab Results  Component Value Date   HGB 12.3* 10/02/2015   HGB 12.9* 10/01/2015   HGB 15.5 09/24/2015   Lab Results  Component Value Date   WBC 14.3*  10/02/2015   PLT 191 10/02/2015   Lab Results  Component Value Date   INR 0.89 07/28/2010   Lab Results  Component Value Date   NA 136 10/01/2015   K 4.0 10/01/2015   CL 105 10/01/2015   CO2 24 10/01/2015   BUN 11 10/01/2015   CREATININE 0.86 10/01/2015   GLUCOSE 130* 10/01/2015    Discharge Medications:     Medication List    STOP taking these medications        acetaminophen 500 MG tablet  Commonly known as:  TYLENOL      TAKE these medications        aspirin 81 MG tablet  Take 1 tablet (81 mg total) by mouth 2 (two) times daily after a meal.     docusate sodium 100 MG capsule  Commonly known as:  COLACE  Take 1 capsule (100 mg total) by mouth 2 (two) times daily.     HYDROmorphone 2 MG tablet  Commonly known as:  DILAUDID  Take 0.5-1 tablets (1-2 mg total) by mouth every 3 (three) hours as needed for severe pain.     ondansetron 4 MG tablet  Commonly known as:  ZOFRAN  Take 1 tablet (4 mg total) by mouth every 6 (six) hours as needed for nausea.     senna 8.6 MG Tabs tablet  Commonly known as:  SENOKOT  Take 2 tablets (17.2 mg total) by mouth  at bedtime.        Diagnostic Studies: Dg Pelvis Portable  09/30/2015  CLINICAL DATA:  Status post total hip anterior approach. EXAM: PORTABLE PELVIS 1-2 VIEWS COMPARISON:  None. FINDINGS: Single AP view of the pelvis is provided. Total right hip arthroplasty hardware appears appropriately positioned. Osseous alignment is anatomic. Expected surgical changes within the surrounding soft tissues. No evidence of surgical complicating feature. IMPRESSION: Postoperative film of the pelvis. Right hip arthroplasty hardware appears appropriately positioned. No evidence of surgical complicating feature. Electronically Signed   By: Bary RichardStan  Maynard M.D.   On: 09/30/2015 20:15   Dg C-arm Gt 120 Min-no Report  09/30/2015  CLINICAL DATA: surgery C-ARM GT 120 MINUTE Fluoroscopy was utilized by the requesting physician.  No radiographic  interpretation.   Dg Hip Operative Unilat With Pelvis Right  09/30/2015  CLINICAL DATA:  Right hip surgery EXAM: DG C-ARM GT 120 MIN-NO REPORT; OPERATIVE RIGHT HIP WITH PELVIS COMPARISON:  None. FLUOROSCOPY TIME:  Radiation Exposure Index (as provided by the fluoroscopic device): 5.36 mGy FINDINGS: Intraoperative fluoroscopic radiographs demonstrating right hip arthroplasty in satisfactory position. No fracture is seen. IMPRESSION: Right hip arthroplasty in satisfactory position. Electronically Signed   By: Charline BillsSriyesh  Krishnan M.D.   On: 09/30/2015 19:15    Disposition: 01-Home or Self Care  Discharge Instructions    Call MD / Call 911    Complete by:  As directed   If you experience chest pain or shortness of breath, CALL 911 and be transported to the hospital emergency room.  If you develope a fever above 101 F, pus (white drainage) or increased drainage or redness at the wound, or calf pain, call your surgeon's office.     Constipation Prevention    Complete by:  As directed   Drink plenty of fluids.  Prune juice may be helpful.  You may use a stool softener, such as Colace (over the counter) 100 mg twice a day.  Use MiraLax (over the counter) for constipation as needed.     Diet - low sodium heart healthy    Complete by:  As directed      Driving restrictions    Complete by:  As directed   No driving for 6 weeks     Increase activity slowly as tolerated    Complete by:  As directed      Lifting restrictions    Complete by:  As directed   No lifting for 6 weeks     TED hose    Complete by:  As directed   Use stockings (TED hose) for 2 weeks on both leg(s).  You may remove them at night for sleeping.           Follow-up Information    Follow up with Lanise Mergen, Cloyde ReamsBrian James, MD. Schedule an appointment as soon as possible for a visit in 2 weeks.   Specialty:  Orthopedic Surgery   Why:  For wound re-check   Contact information:   [address] [place]  [id number] [phone]       [facility]   Why:  Home Health Physical Therapy   Contact information:   [address] [place] [address] [phone]        Signed: Garnet KoyanagiSwinteck, Shanen Norris James 10/02/2015, 2:24 PM

## 2015-10-01 NOTE — Evaluation (Signed)
Occupational Therapy Evaluation Patient Details Name: Cole OysterRoderick A Bowe MRN: [id number] DOB: 07/27/1975 Today's Date: 10/01/2015    History of Present Illness s/p R DA THA due to AVN   Clinical Impression   This 40 year old man was admitted for the above sx.  He will benefit from continued OT in acute setting to continue education on toilet transfers, tub DME options and AE.  Goals are for min guard to min A.      Follow Up Recommendations  Supervision/Assistance - 24 hour    Equipment Recommendations  3 in 1 bedside comode    Recommendations for Other Services       Precautions / Restrictions Precautions Precautions: Fall Restrictions Weight Bearing Restrictions: No Other Position/Activity Restrictions: WBAT      Mobility Bed Mobility Overal bed mobility: Needs Assistance;+2 for physical assistance;+ 2 for safety/equipment Bed Mobility: Supine to Sit     Supine to sit: Min assist;+2 for physical assistance;+2 for safety/equipment     General bed mobility comments: cues for sequence and use of L LE to self assist  Transfers Overall transfer level: Needs assistance Equipment used: Rolling walker (2 wheeled) Transfers: Sit to/from Stand Sit to Stand: Min assist;+2 physical assistance;From elevated surface;+2 safety/equipment         General transfer comment: cues for LE management and use of UEs to self assist    Balance                                            ADL Overall ADL's : Needs assistance/impaired     Grooming: Set up;Sitting   Upper Body Bathing: Minimal assitance;Sitting   Lower Body Bathing: Moderate assistance;Sit to/from stand   Upper Body Dressing : Minimal assistance;Sitting   Lower Body Dressing: Maximal assistance;Sit to/from stand   Toilet Transfer: Minimal assistance;+2 for safety/equipment;RW;Ambulation (recliner)   Toileting- Clothing Manipulation and Hygiene: Minimal assistance;Sit to/from stand         General ADL Comments: Pt's RUE is numb after sx, so increased A needed for UB adls.  Did not try AE this session as pt is R handed, but I did demonstrate it.    He does have plenty of assistance at home.  Mother is familiar with tub bench. Discussed this vs sponge bathing initially.  Pt has a garden tub; not sure tub bench is high enough.       Vision     Perception     Praxis      Pertinent Vitals/Pain Pain Assessment: 0-10 Pain Score: 7  Pain Location: L hip Pain Descriptors / Indicators: Aching;Burning;Sore Pain Intervention(s): Limited activity within patient's tolerance;Monitored during session;Premedicated before session;Ice applied     Hand Dominance     Extremity/Trunk Assessment Upper Extremity Assessment Upper Extremity Assessment: RUE deficits/detail RUE Deficits / Details: Pt reports "I cant feel my fingers" but motor function appears intact.  Physician aware      Cervical / Trunk Assessment Cervical / Trunk Assessment: Normal   Communication Communication Communication: No difficulties   Cognition Arousal/Alertness: Awake/alert Behavior During Therapy: WFL for tasks assessed/performed Overall Cognitive Status: Within Functional Limits for tasks assessed                     General Comments       Exercises      Shoulder Instructions  Home Living Family/patient expects to be discharged to:: Private residence Living Arrangements: Spouse/significant other Available Help at Discharge: Family Type of Home: Apartment Home Access: Stairs to enter Secretary/administrator of Steps: 16 Entrance Stairs-Rails: Right;Left Home Layout: One level     Bathroom Shower/Tub: Chief Strategy Officer: Standard     Home Equipment: None          Prior Functioning/Environment Level of Independence: Independent             OT Diagnosis: Acute pain   OT Problem List: Decreased strength;Decreased activity tolerance;Decreased  knowledge of use of DME or AE;Impaired sensation;Pain   OT Treatment/Interventions: Self-care/ADL training;DME and/or AE instruction;Patient/family education    OT Goals(Current goals can be found in the care plan section) Acute Rehab OT Goals Patient Stated Goal: Regain Independence OT Goal Formulation: With patient Time For Goal Achievement: 10/08/15 Potential to Achieve Goals: Good ADL Goals Pt Will Transfer to Toilet: with min guard assist;ambulating;bedside commode Pt Will Perform Toileting - Clothing Manipulation and hygiene: with min guard assist;sit to/from stand Pt Will Perform Tub/Shower Transfer: Tub transfer;with min assist;ambulating;tub bench (vs verbalize understanding (family has seen)) Additional ADL Goal #1: pt will use AE with set up  OT Frequency: Min 2X/week   Barriers to D/C:            Co-evaluation PT/OT/SLP Co-Evaluation/Treatment: Yes Reason for Co-Treatment: For patient/therapist safety PT goals addressed during session: Mobility/safety with mobility OT goals addressed during session: ADL's and self-care      End of Session    Activity Tolerance: Patient limited by pain Patient left: in chair;with call bell/phone within reach;with nursing/sitter in room   Time: 4540-9811 OT Time Calculation (min): 37 min Charges:  OT General Charges $OT Visit: 1 Procedure OT Evaluation $OT Eval Low Complexity: 1 Procedure G-Codes:    Foch Rosenwald October 15, 2015, 4:19 PM Marica Otter, OTR/L [phone] 10/15/15

## 2015-10-01 NOTE — Progress Notes (Signed)
PT Cancellation Note  Patient Details Name: Cole Richardson MRN: [id number] DOB: 08/03/1975   Cancelled Treatment:     PT deferred this am 2* pt's ongoing nausea and elevated pain level.  Will follow in pm.   Helaman Mecca 10/01/2015, 12:45 PM

## 2015-10-01 NOTE — Care Management Note (Signed)
Case Management Note  Patient Details  Name: Cole Richardson MRN: [id number] Date of Birth: 25-May-1975  Subjective/Objective:      40 yo admitted with Avascular necrosis of hip.              Action/Plan: From home with spouse.  Met with pt at bedside for choice for [facility] services and pt chose [place]. Pt on [facility] list for HHPT.  Pt has not worked with hospital PT yet so unsure of DME recommendations.  CM will continue to follow and assist as needed.  Expected Discharge Date:                  Expected Discharge Plan:  [place]  In-House Referral:     Discharge planning Services  CM Consult  Post Acute Care Choice:    Choice offered to:     DME Arranged:    DME Agency:     HH Arranged:  PT HH Agency:  [place]  Status of Service:  In process, will continue to follow  Medicare Important Message Given:    Date Medicare IM Given:    Medicare IM give by:    Date Additional Medicare IM Given:    Additional Medicare Important Message give by:     If discussed at [place] of Stay Meetings, dates discussed:    Additional CommentsLynnell Catalan, RN 10/01/2015, 2:06 PM  [phone]

## 2015-10-01 NOTE — Progress Notes (Signed)
   Subjective:  Patient reports pain as mild to moderate.  C/o nausea; emesis x1 this am. Denies CP/SOB. C/o paresthesias all digits of both hands, R > L with small fingers spared.  Objective:   VITALS:   Filed Vitals:   09/30/15 2215 09/30/15 2315 10/01/15 0015 10/01/15 0415  BP: 121/71 129/70 116/66 114/63  Pulse: 73 58 64 66  Temp: 98.3 F (36.8 C) 98.2 F (36.8 C) 98.4 F (36.9 C) 98.7 F (37.1 C)  TempSrc: Oral Oral Oral Oral  Resp: 16 16 16 16   Height:      Weight:      SpO2: 100% 100% 99% 99%    ABD soft Sensation intact distally Intact pulses distally Dorsiflexion/Plantar flexion intact Incision: dressing C/D/I Compartment soft  B/L UEs: c/o subjective sensory change to all digits, R > L, with small fingers spared. (+) motor function AIN, PIN, ulnar; able to abduct thumbs. 2+ radial. Hands warm and well perfused.   Lab Results  Component Value Date   WBC 16.6* 10/01/2015   HGB 12.9* 10/01/2015   HCT 38.6* 10/01/2015   MCV 87.7 10/01/2015   PLT 206 10/01/2015   BMET    Component Value Date/Time   NA 136 10/01/2015 0347   K 4.0 10/01/2015 0347   CL 105 10/01/2015 0347   CO2 24 10/01/2015 0347   GLUCOSE 130* 10/01/2015 0347   BUN 11 10/01/2015 0347   CREATININE 0.86 10/01/2015 0347   CALCIUM 8.2* 10/01/2015 0347   GFRNONAA >60 10/01/2015 0347   GFRAA >60 10/01/2015 0347     Assessment/Plan: 1 Day Post-Op   Active Problems:   Avascular necrosis of hip (HCC)  WBAT with walker PO pain control DVT ppx: ASA 81 mg PO BID, SCDs, TEDs PT/OT N/V: clear liquid diet- progress slowly, antiemetics, monitor for postop ileus B/L hand paresthesias: not consistent with ulnar neuritis, likely due to positioning & third spacing of fluids postop, 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

## 2015-10-02 LAB — CBC
HCT: 36.1 % — ABNORMAL LOW (ref 39.0–52.0)
HEMOGLOBIN: 12.3 g/dL — AB (ref 13.0–17.0)
MCH: 29.8 pg (ref 26.0–34.0)
MCHC: 34.1 g/dL (ref 30.0–36.0)
MCV: 87.4 fL (ref 78.0–100.0)
Platelets: 191 10*3/uL (ref 150–400)
RBC: 4.13 MIL/uL — ABNORMAL LOW (ref 4.22–5.81)
RDW: 13.1 % (ref 11.5–15.5)
WBC: 14.3 10*3/uL — ABNORMAL HIGH (ref 4.0–10.5)

## 2015-10-02 NOTE — Progress Notes (Signed)
Physical Therapy Treatment Patient Details Name: Cole Richardson MRN: [id number] DOB: 12/09/1975 Today's Date: 10/02/2015    History of Present Illness s/p R DA THA due to AVN    PT Comments    POD # 10 Assisted with amb a greater distance with spouse.  Practiced stairs with spouse 10 steps using one R rail and one L crutch.    Follow Up Recommendations  Home health PT     Equipment Recommendations  Rolling walker with 5" wheels    Recommendations for Other Services       Precautions / Restrictions Precautions Precautions: Fall Restrictions Weight Bearing Restrictions: No Other Position/Activity Restrictions: WBAT    Mobility  Bed Mobility Overal bed mobility: Needs Assistance Bed Mobility: Supine to Sit     Supine to sit: Min assist     General bed mobility comments: OOB in recliner  Transfers Overall transfer level: Needs assistance Equipment used: Rolling walker (2 wheeled) Transfers: Sit to/from Stand Sit to Stand: Min guard         General transfer comment: cues for hand placement and LE management.  Increased time   Ambulation/Gait Ambulation/Gait assistance: Min guard Ambulation Distance (Feet): 75 Feet Assistive device: Rolling walker (2 wheeled) Gait Pattern/deviations: Step-to pattern;Decreased stance time - right;Trunk flexed Gait velocity: decr   General Gait Details: cues for sequence, posture and position from RW   Stairs Stairs: Yes Stairs assistance: Min guard Stair Management: One rail Right;Step to pattern;Forwards;With crutches Number of Stairs: 10 General stair comments: with spouse, practiced stairs  Wheelchair Mobility    Modified Rankin (Stroke Patients Only)       Balance                                    Cognition Arousal/Alertness: Awake/alert Behavior During Therapy: WFL for tasks assessed/performed Overall Cognitive Status: Within Functional Limits for tasks assessed                       Exercises      General Comments        Pertinent Vitals/Pain Pain Assessment: 0-10 Pain Score: 7  Pain Location: with activity Pain Descriptors / Indicators: Tightness;Sore Pain Intervention(s): Monitored during session;Premedicated before session;Repositioned;Ice applied    Home Living                      Prior Function            PT Goals (current goals can now be found in the care plan section) Progress towards PT goals: Progressing toward goals    Frequency  7X/week    PT Plan Current plan remains appropriate    Co-evaluation             End of Session Equipment Utilized During Treatment: Gait belt Activity Tolerance: Patient tolerated treatment well Patient left: in chair;with call bell/phone within reach;with family/visitor present     Time: 7829-56210956-1024 PT Time Calculation (min) (ACUTE ONLY): 28 min  Charges:  $Gait Training: 8-22 mins $Therapeutic Activity: 8-22 mins                    G Codes:      Felecia ShellingLori Severa Jeremiah  PTA [facility]  Acute  Rehab Pager      [phone]

## 2015-10-02 NOTE — Progress Notes (Signed)
RN reviewed discharge instructions with patient and family. All questions answered.   Paperwork and prescriptions given.   NT rolled patient down to family car with all belongings.   

## 2015-10-02 NOTE — Progress Notes (Signed)
   Subjective:  Patient reports pain as mild to moderate.  N/V resolved, tolaerating PO.  Denies CP/SOB. States feeling has almost entirely returned to hands.  Objective:   VITALS:   Filed Vitals:   10/01/15 1037 10/01/15 1415 10/01/15 2239 10/02/15 0617  BP: 102/51 107/75 128/69 114/60  Pulse: 56 71 93 74  Temp: 98.1 F (36.7 C) 99.3 F (37.4 C) 98.8 F (37.1 C) 98.8 F (37.1 C)  TempSrc: Oral Oral Oral Oral  Resp: 16 16 16 14   Height:      Weight:      SpO2: 99% 100% 98% 98%    ABD soft Sensation intact distally Intact pulses distally Dorsiflexion/Plantar flexion intact Incision: dressing C/D/I Compartment soft  B/L UEs: L hand sensation WNL; R hand improving. (+) motor function AIN, PIN, ulnar; able to abduct thumbs. 2+ radial. Hands warm and well perfused.   Lab Results  Component Value Date   WBC 14.3* 10/02/2015   HGB 12.3* 10/02/2015   HCT 36.1* 10/02/2015   MCV 87.4 10/02/2015   PLT 191 10/02/2015   BMET    Component Value Date/Time   NA 136 10/01/2015 0347   K 4.0 10/01/2015 0347   CL 105 10/01/2015 0347   CO2 24 10/01/2015 0347   GLUCOSE 130* 10/01/2015 0347   BUN 11 10/01/2015 0347   CREATININE 0.86 10/01/2015 0347   CALCIUM 8.2* 10/01/2015 0347   GFRNONAA >60 10/01/2015 0347   GFRAA >60 10/01/2015 0347     Assessment/Plan: 2 Days Post-Op   Principal Problem:   Avascular necrosis of hip (HCC)  WBAT with walker PO pain control DVT ppx: ASA 81 mg PO BID, SCDs, TEDs PT/OT N/V: clear liquid diet- progress slowly, antiemetics, monitor for postop ileus B/L hand paresthesias: improving, monitor Dispo: D/C home today    Garnet KoyanagiSwinteck, Kaimana Neuzil James 10/02/2015, 7:21 AM   Samson FredericBrian Ty Oshima, MD Cell [phone]

## 2015-10-02 NOTE — Progress Notes (Signed)
Occupational Therapy Treatment Patient Cole Richardson MRN: [id number] DOB: 01/02/1976 Today's Date: 10/02/2015    History of present illness s/p R DA THA due to AVN   OT comments  Pt progressing well today. Still reports some numbness in R hand, particularly 1st through 3rd digits. Education provided on 3in1 transfer and safety with LB dressing and pt practiced 3in1 transfers. Pt planning for d/c later today.   Follow Up Recommendations  Supervision/Assistance - 24 hour    Equipment Recommendations  3 in 1 bedside comode    Recommendations for Other Services      Precautions / Restrictions Precautions Precautions: Fall Restrictions Weight Bearing Restrictions: No Other Position/Activity Restrictions: WBAT       Mobility Bed Mobility Overal bed mobility: Needs Assistance Bed Mobility: Supine to Sit     Supine to sit: Min assist     General bed mobility comments: assist for R LE over to EOB. increased time.  Transfers Overall transfer level: Needs assistance Equipment used: Rolling walker (2 wheeled) Transfers: Sit to/from Stand Sit to Stand: Min guard         General transfer comment: cues for hand placement and LE management.    Balance                                   ADL                           Toilet Transfer: Min guard;Ambulation;BSC;RW   Toileting- Architect and Hygiene: Min guard;Sit to/from stand         General ADL Comments: Pt reports that R hand numbness is improving and is mostly in 1st-3rd digits. Discussed AE options but pt is not interested in purchasing AE and states he has assist available for LB dressing. Pt's significant other states she will likely pick up a long handled sponge for showering. Discussed tubbench option and pt states he is ok with sponge bathing a few days and see how he progresses. They are aware of where to obtain a tubbench if desired/needed. Significant other  states the garden tub isnt really high. Discussed 3in1 and how to adjust to proper height. Also discussed sequence for LB dressing and having walker in front of him when he stands to pull up clothing.       Vision                     Perception     Praxis      Cognition   Behavior During Therapy: WFL for tasks assessed/performed Overall Cognitive Status: Within Functional Limits for tasks assessed                       Extremity/Trunk Assessment               Exercises     Shoulder Instructions       General Comments      Pertinent Vitals/ Pain       Pain Score: 7  Pain Location: 5 at rest. 7 with activity. R hip Pain Descriptors / Indicators: Tightness;Sore Pain Intervention(s): Monitored during session;Repositioned;Ice applied  Home Living  Prior Functioning/Environment              Frequency Min 2X/week     Progress Toward Goals  OT Goals(current goals can now be found in the care plan section)  Progress towards OT goals: Progressing toward goals     Plan Discharge plan remains appropriate    Co-evaluation                 End of Session Equipment Utilized During Treatment: Rolling walker   Activity Tolerance Patient tolerated treatment well   Patient Left in chair;with call bell/phone within reach;with family/visitor present   Nurse Communication          Time: 9528-41320850-0916 OT Time Calculation (min): 26 min  Charges: OT General Charges $OT Visit: 1 Procedure OT Treatments $Self Care/Home Management : 8-22 mins $Therapeutic Activity: 8-22 mins  Lennox LaityStone, Alaster Asfaw Stafford  [phone] 10/02/2015, 10:59 AM

## 2015-10-02 NOTE — Progress Notes (Signed)
Physical Therapy Treatment Patient Details Name: Cole Richardson MRN: [id number] DOB: 07/12/1975 Today's Date: 10/02/2015    History of Present Illness s/p R DA THA due to AVN    PT Comments    POD # 2 Returned to perform and educate HEP spouse and pt.  Performed all following handout.  Instructed on proper tech and use of ICE.   Pt ready for D/C to home.   Follow Up Recommendations  Home health PT     Equipment Recommendations  Rolling walker with 5" wheels    Recommendations for Other Services       Precautions / Restrictions Precautions Precautions: Fall Restrictions Weight Bearing Restrictions: No Other Position/Activity Restrictions: WBAT    Mobility  Bed Mobility Overal bed mobility: Needs Assistance Bed Mobility: Supine to Sit     Supine to sit: Min assist     General bed mobility comments: OOB in recliner  Transfers Overall transfer level: Needs assistance Equipment used: Rolling walker (2 wheeled) Transfers: Sit to/from Stand Sit to Stand: Min guard         General transfer comment: cues for hand placement and LE management.  Increased time   Ambulation/Gait Ambulation/Gait assistance: Min guard Ambulation Distance (Feet): 75 Feet Assistive device: Rolling walker (2 wheeled) Gait Pattern/deviations: Step-to pattern;Decreased stance time - right;Trunk flexed Gait velocity: decr   General Gait Details: cues for sequence, posture and position from RW   Stairs Stairs: Yes Stairs assistance: Min guard Stair Management: One rail Right;Step to pattern;Forwards;With crutches Number of Stairs: 10 General stair comments: with spouse, practiced stairs  Wheelchair Mobility    Modified Rankin (Stroke Patients Only)       Balance                                    Cognition Arousal/Alertness: Awake/alert Behavior During Therapy: WFL for tasks assessed/performed Overall Cognitive Status: Within Functional Limits for tasks  assessed                      Exercises   Total Hip Replacement TE's 10 reps ankle pumps 10 reps knee presses 10 reps heel slides 10 reps SAQ's 10 reps ABD 10 reps all standing TE's Followed by ICE     General Comments        Pertinent Vitals/Pain Pain Assessment: 0-10 Pain Score: 7  Pain Location: with activity Pain Descriptors / Indicators: Tightness;Sore Pain Intervention(s): Monitored during session;Premedicated before session;Repositioned;Ice applied    Home Living                      Prior Function            PT Goals (current goals can now be found in the care plan section) Progress towards PT goals: Progressing toward goals    Frequency  7X/week    PT Plan Current plan remains appropriate    Co-evaluation             End of Session Equipment Utilized During Treatment: Gait belt Activity Tolerance: Patient tolerated treatment well Patient left: in chair;with call bell/phone within reach;with family/visitor present     Time: 9629-52841059-1125 PT Time Calculation (min) (ACUTE ONLY): 26 min  Charges:  $Gait Training: 8-22 mins $Therapeutic Exercise: 8-22 mins                     G Codes:  Rica Koyanagi  PTA [facility]  Acute  Rehab Pager      [phone]

## 2015-10-02 NOTE — Care Management Note (Signed)
Case Management Note  Patient Details  Name: Ranelle OysterRoderick A Byland MRN: [id number] Date of Birth: 02/28/1976  Subjective/Objective:     Right total hip arthroplasty, anterior approach               Action/Plan: Discharge Planning: AVS reviewed: NCM spoke to pt at bedside. [facility] Contacted [facility] DME rep for RW and 3n1 for home.   PCP- Ailene RavelHAMRICK, MAURA L MD Expected Discharge Date:  10/02/2015               Expected Discharge Plan:  Home w Home Health Services  In-House Referral:  NA  Discharge planning Services  CM Consult  Post Acute Care Choice:  Home Health Choice offered to:  Patient  DME Arranged:  3-N-1, Walker rolling DME Agency:  [facility]  [facility]  [facility]  Status of Service:  Completed, signed off  Medicare Important Message Given:    Date Medicare IM Given:    Medicare IM give by:    Date Additional Medicare IM Given:    Additional Medicare Important Message give by:     If discussed at Long Length of Stay Meetings, dates discussed:    Additional Comments:  Elliot CousinShavis, Lennette Fader Ellen, RN 10/02/2015, 12:05 PM

## 2015-10-04 DIAGNOSIS — Z96641 Presence of right artificial hip joint: Secondary | ICD-10-CM | POA: Diagnosis not present

## 2015-10-04 DIAGNOSIS — Z471 Aftercare following joint replacement surgery: Secondary | ICD-10-CM | POA: Diagnosis not present

## 2015-10-04 DIAGNOSIS — Z7982 Long term (current) use of aspirin: Secondary | ICD-10-CM | POA: Diagnosis not present

## 2015-10-06 DIAGNOSIS — Z471 Aftercare following joint replacement surgery: Secondary | ICD-10-CM | POA: Diagnosis not present

## 2015-10-06 DIAGNOSIS — Z7982 Long term (current) use of aspirin: Secondary | ICD-10-CM | POA: Diagnosis not present

## 2015-10-06 DIAGNOSIS — Z96641 Presence of right artificial hip joint: Secondary | ICD-10-CM | POA: Diagnosis not present

## 2015-10-08 DIAGNOSIS — Z471 Aftercare following joint replacement surgery: Secondary | ICD-10-CM | POA: Diagnosis not present

## 2015-10-08 DIAGNOSIS — Z96641 Presence of right artificial hip joint: Secondary | ICD-10-CM | POA: Diagnosis not present

## 2015-10-08 DIAGNOSIS — Z7982 Long term (current) use of aspirin: Secondary | ICD-10-CM | POA: Diagnosis not present

## 2015-10-12 DIAGNOSIS — Z96641 Presence of right artificial hip joint: Secondary | ICD-10-CM | POA: Diagnosis not present

## 2015-10-12 DIAGNOSIS — Z7982 Long term (current) use of aspirin: Secondary | ICD-10-CM | POA: Diagnosis not present

## 2015-10-12 DIAGNOSIS — Z471 Aftercare following joint replacement surgery: Secondary | ICD-10-CM | POA: Diagnosis not present

## 2015-10-14 DIAGNOSIS — Z7982 Long term (current) use of aspirin: Secondary | ICD-10-CM | POA: Diagnosis not present

## 2015-10-14 DIAGNOSIS — Z96641 Presence of right artificial hip joint: Secondary | ICD-10-CM | POA: Diagnosis not present

## 2015-10-14 DIAGNOSIS — Z471 Aftercare following joint replacement surgery: Secondary | ICD-10-CM | POA: Diagnosis not present

## 2015-10-15 DIAGNOSIS — Z96641 Presence of right artificial hip joint: Secondary | ICD-10-CM | POA: Diagnosis not present

## 2015-10-15 DIAGNOSIS — Z471 Aftercare following joint replacement surgery: Secondary | ICD-10-CM | POA: Diagnosis not present

## 2015-10-19 DIAGNOSIS — Z96641 Presence of right artificial hip joint: Secondary | ICD-10-CM | POA: Diagnosis not present

## 2015-10-19 DIAGNOSIS — Z7982 Long term (current) use of aspirin: Secondary | ICD-10-CM | POA: Diagnosis not present

## 2015-10-19 DIAGNOSIS — Z471 Aftercare following joint replacement surgery: Secondary | ICD-10-CM | POA: Diagnosis not present

## 2015-10-21 DIAGNOSIS — Z96641 Presence of right artificial hip joint: Secondary | ICD-10-CM | POA: Diagnosis not present

## 2015-10-21 DIAGNOSIS — Z471 Aftercare following joint replacement surgery: Secondary | ICD-10-CM | POA: Diagnosis not present

## 2015-10-21 DIAGNOSIS — Z7982 Long term (current) use of aspirin: Secondary | ICD-10-CM | POA: Diagnosis not present

## 2015-11-16 DIAGNOSIS — Z96641 Presence of right artificial hip joint: Secondary | ICD-10-CM | POA: Diagnosis not present

## 2015-11-16 DIAGNOSIS — Z471 Aftercare following joint replacement surgery: Secondary | ICD-10-CM | POA: Diagnosis not present

## 2016-04-10 DIAGNOSIS — Z96641 Presence of right artificial hip joint: Secondary | ICD-10-CM | POA: Diagnosis not present

## 2016-04-10 DIAGNOSIS — Z471 Aftercare following joint replacement surgery: Secondary | ICD-10-CM | POA: Diagnosis not present

## 2016-05-23 DIAGNOSIS — N049 Nephrotic syndrome with unspecified morphologic changes: Secondary | ICD-10-CM | POA: Diagnosis not present

## 2016-06-01 DIAGNOSIS — E559 Vitamin D deficiency, unspecified: Secondary | ICD-10-CM | POA: Diagnosis not present

## 2016-06-01 DIAGNOSIS — N040 Nephrotic syndrome with minor glomerular abnormality: Secondary | ICD-10-CM | POA: Diagnosis not present

## 2016-06-01 DIAGNOSIS — I10 Essential (primary) hypertension: Secondary | ICD-10-CM | POA: Diagnosis not present

## 2016-07-05 DIAGNOSIS — N040 Nephrotic syndrome with minor glomerular abnormality: Secondary | ICD-10-CM | POA: Diagnosis not present

## 2016-07-05 DIAGNOSIS — E559 Vitamin D deficiency, unspecified: Secondary | ICD-10-CM | POA: Diagnosis not present

## 2016-07-11 DIAGNOSIS — N040 Nephrotic syndrome with minor glomerular abnormality: Secondary | ICD-10-CM | POA: Diagnosis not present

## 2016-07-11 DIAGNOSIS — Z6835 Body mass index (BMI) 35.0-35.9, adult: Secondary | ICD-10-CM | POA: Diagnosis not present

## 2016-07-11 DIAGNOSIS — I10 Essential (primary) hypertension: Secondary | ICD-10-CM | POA: Diagnosis not present

## 2016-08-16 DIAGNOSIS — N040 Nephrotic syndrome with minor glomerular abnormality: Secondary | ICD-10-CM | POA: Diagnosis not present

## 2016-08-22 DIAGNOSIS — N049 Nephrotic syndrome with unspecified morphologic changes: Secondary | ICD-10-CM | POA: Diagnosis not present

## 2016-08-22 DIAGNOSIS — N040 Nephrotic syndrome with minor glomerular abnormality: Secondary | ICD-10-CM | POA: Diagnosis not present

## 2016-08-22 DIAGNOSIS — Z6836 Body mass index (BMI) 36.0-36.9, adult: Secondary | ICD-10-CM | POA: Diagnosis not present

## 2016-08-22 DIAGNOSIS — I10 Essential (primary) hypertension: Secondary | ICD-10-CM | POA: Diagnosis not present

## 2016-10-23 DIAGNOSIS — N040 Nephrotic syndrome with minor glomerular abnormality: Secondary | ICD-10-CM | POA: Diagnosis not present

## 2016-10-30 DIAGNOSIS — Z6836 Body mass index (BMI) 36.0-36.9, adult: Secondary | ICD-10-CM | POA: Diagnosis not present

## 2016-10-30 DIAGNOSIS — N040 Nephrotic syndrome with minor glomerular abnormality: Secondary | ICD-10-CM | POA: Diagnosis not present

## 2016-10-30 DIAGNOSIS — N049 Nephrotic syndrome with unspecified morphologic changes: Secondary | ICD-10-CM | POA: Diagnosis not present

## 2016-10-30 DIAGNOSIS — I10 Essential (primary) hypertension: Secondary | ICD-10-CM | POA: Diagnosis not present

## 2016-12-26 DIAGNOSIS — N040 Nephrotic syndrome with minor glomerular abnormality: Secondary | ICD-10-CM | POA: Diagnosis not present

## 2016-12-26 DIAGNOSIS — N049 Nephrotic syndrome with unspecified morphologic changes: Secondary | ICD-10-CM | POA: Diagnosis not present

## 2017-01-01 DIAGNOSIS — N040 Nephrotic syndrome with minor glomerular abnormality: Secondary | ICD-10-CM | POA: Diagnosis not present

## 2017-01-01 DIAGNOSIS — I10 Essential (primary) hypertension: Secondary | ICD-10-CM | POA: Diagnosis not present

## 2017-01-01 DIAGNOSIS — Z6836 Body mass index (BMI) 36.0-36.9, adult: Secondary | ICD-10-CM | POA: Diagnosis not present

## 2017-01-01 DIAGNOSIS — N049 Nephrotic syndrome with unspecified morphologic changes: Secondary | ICD-10-CM | POA: Diagnosis not present

## 2017-01-10 DIAGNOSIS — N040 Nephrotic syndrome with minor glomerular abnormality: Secondary | ICD-10-CM | POA: Diagnosis not present

## 2017-01-10 DIAGNOSIS — N049 Nephrotic syndrome with unspecified morphologic changes: Secondary | ICD-10-CM | POA: Diagnosis not present

## 2017-01-17 DIAGNOSIS — N040 Nephrotic syndrome with minor glomerular abnormality: Secondary | ICD-10-CM | POA: Diagnosis not present

## 2017-01-24 DIAGNOSIS — N040 Nephrotic syndrome with minor glomerular abnormality: Secondary | ICD-10-CM | POA: Diagnosis not present

## 2017-01-31 DIAGNOSIS — N040 Nephrotic syndrome with minor glomerular abnormality: Secondary | ICD-10-CM | POA: Diagnosis not present

## 2017-02-07 DIAGNOSIS — I10 Essential (primary) hypertension: Secondary | ICD-10-CM | POA: Diagnosis not present

## 2017-02-07 DIAGNOSIS — N049 Nephrotic syndrome with unspecified morphologic changes: Secondary | ICD-10-CM | POA: Diagnosis not present

## 2017-02-16 DIAGNOSIS — N040 Nephrotic syndrome with minor glomerular abnormality: Secondary | ICD-10-CM | POA: Diagnosis not present

## 2017-02-16 DIAGNOSIS — I10 Essential (primary) hypertension: Secondary | ICD-10-CM | POA: Diagnosis not present

## 2017-02-16 DIAGNOSIS — N049 Nephrotic syndrome with unspecified morphologic changes: Secondary | ICD-10-CM | POA: Diagnosis not present

## 2017-02-21 DIAGNOSIS — N040 Nephrotic syndrome with minor glomerular abnormality: Secondary | ICD-10-CM | POA: Diagnosis not present

## 2017-03-06 DIAGNOSIS — N040 Nephrotic syndrome with minor glomerular abnormality: Secondary | ICD-10-CM | POA: Diagnosis not present

## 2017-03-14 DIAGNOSIS — N040 Nephrotic syndrome with minor glomerular abnormality: Secondary | ICD-10-CM | POA: Diagnosis not present

## 2017-03-22 DIAGNOSIS — N040 Nephrotic syndrome with minor glomerular abnormality: Secondary | ICD-10-CM | POA: Diagnosis not present

## 2017-04-22 IMAGING — DX DG PORTABLE PELVIS
1 series · 1 of 1 positions shown · non-contrast
Comparison: None.

CLINICAL DATA: Status post total hip anterior approach.

EXAM:
PORTABLE PELVIS 1-2 VIEWS

[pelvis ap]
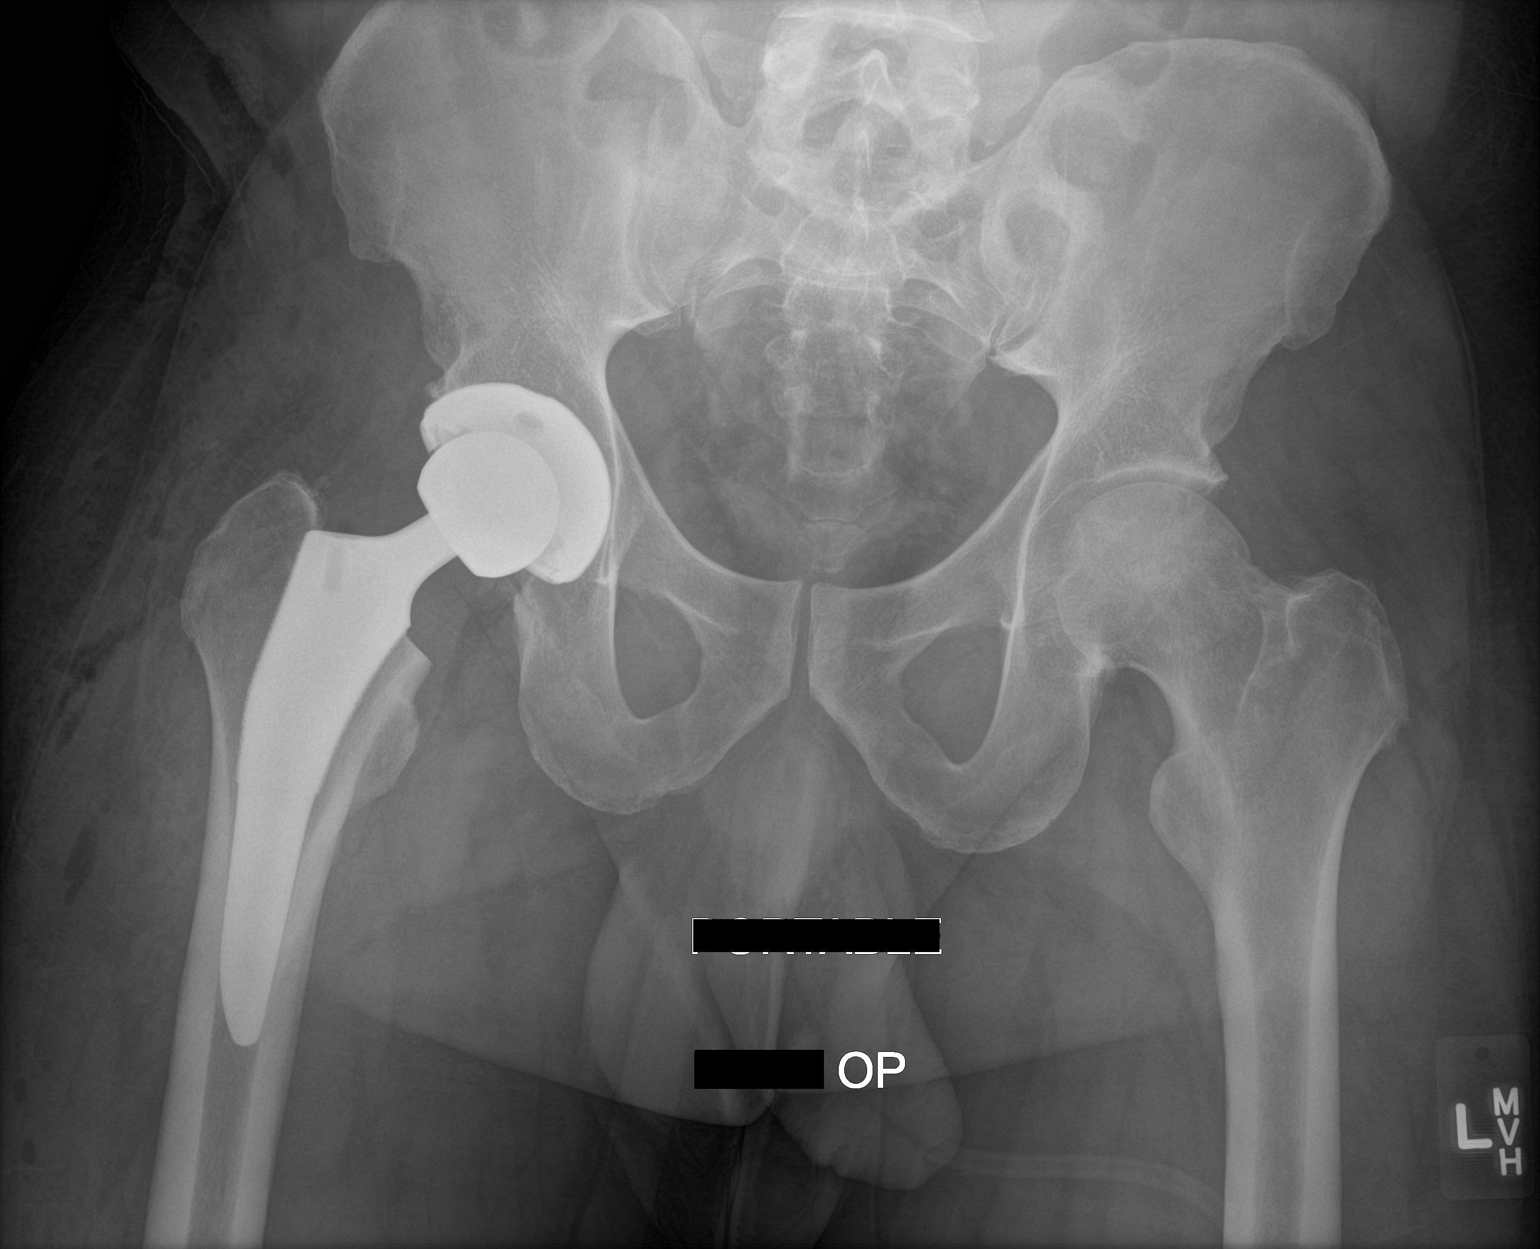

[1 of 1 positions shown; findings below may reference images not displayed]

FINDINGS: Single AP view of the pelvis is provided. Total right hip
arthroplasty hardware appears appropriately positioned. Osseous
alignment is anatomic. Expected surgical changes within the
surrounding soft tissues. No evidence of surgical complicating
feature.
IMPRESSION: Postoperative film of the pelvis. Right hip arthroplasty hardware
appears appropriately positioned. No evidence of surgical
complicating feature.

## 2017-04-30 DIAGNOSIS — N049 Nephrotic syndrome with unspecified morphologic changes: Secondary | ICD-10-CM | POA: Diagnosis not present

## 2017-05-08 DIAGNOSIS — N049 Nephrotic syndrome with unspecified morphologic changes: Secondary | ICD-10-CM | POA: Diagnosis not present

## 2017-05-08 DIAGNOSIS — E559 Vitamin D deficiency, unspecified: Secondary | ICD-10-CM | POA: Diagnosis not present

## 2017-05-08 DIAGNOSIS — N040 Nephrotic syndrome with minor glomerular abnormality: Secondary | ICD-10-CM | POA: Diagnosis not present

## 2017-05-08 DIAGNOSIS — I10 Essential (primary) hypertension: Secondary | ICD-10-CM | POA: Diagnosis not present

## 2017-06-02 DIAGNOSIS — H5213 Myopia, bilateral: Secondary | ICD-10-CM | POA: Diagnosis not present

## 2017-07-02 DIAGNOSIS — N040 Nephrotic syndrome with minor glomerular abnormality: Secondary | ICD-10-CM | POA: Diagnosis not present

## 2017-07-12 DIAGNOSIS — N040 Nephrotic syndrome with minor glomerular abnormality: Secondary | ICD-10-CM | POA: Diagnosis not present

## 2017-07-12 DIAGNOSIS — E559 Vitamin D deficiency, unspecified: Secondary | ICD-10-CM | POA: Diagnosis not present

## 2017-07-12 DIAGNOSIS — I10 Essential (primary) hypertension: Secondary | ICD-10-CM | POA: Diagnosis not present

## 2017-09-04 DIAGNOSIS — N040 Nephrotic syndrome with minor glomerular abnormality: Secondary | ICD-10-CM | POA: Diagnosis not present

## 2017-09-10 DIAGNOSIS — I10 Essential (primary) hypertension: Secondary | ICD-10-CM | POA: Diagnosis not present

## 2017-09-10 DIAGNOSIS — N040 Nephrotic syndrome with minor glomerular abnormality: Secondary | ICD-10-CM | POA: Diagnosis not present

## 2017-09-10 DIAGNOSIS — N182 Chronic kidney disease, stage 2 (mild): Secondary | ICD-10-CM | POA: Diagnosis not present

## 2017-10-08 DIAGNOSIS — M25552 Pain in left hip: Secondary | ICD-10-CM | POA: Diagnosis not present

## 2017-10-22 DIAGNOSIS — M1611 Unilateral primary osteoarthritis, right hip: Secondary | ICD-10-CM | POA: Diagnosis not present

## 2017-10-22 DIAGNOSIS — M1612 Unilateral primary osteoarthritis, left hip: Secondary | ICD-10-CM | POA: Diagnosis not present

## 2017-10-22 DIAGNOSIS — M87050 Idiopathic aseptic necrosis of pelvis: Secondary | ICD-10-CM | POA: Diagnosis not present

## 2017-11-05 DIAGNOSIS — N040 Nephrotic syndrome with minor glomerular abnormality: Secondary | ICD-10-CM | POA: Diagnosis not present

## 2017-11-07 DIAGNOSIS — M1611 Unilateral primary osteoarthritis, right hip: Secondary | ICD-10-CM | POA: Diagnosis not present

## 2017-11-12 DIAGNOSIS — I10 Essential (primary) hypertension: Secondary | ICD-10-CM | POA: Diagnosis not present

## 2017-11-12 DIAGNOSIS — N182 Chronic kidney disease, stage 2 (mild): Secondary | ICD-10-CM | POA: Diagnosis not present

## 2017-11-12 DIAGNOSIS — N040 Nephrotic syndrome with minor glomerular abnormality: Secondary | ICD-10-CM | POA: Diagnosis not present

## 2017-11-30 DIAGNOSIS — M1612 Unilateral primary osteoarthritis, left hip: Secondary | ICD-10-CM | POA: Diagnosis not present

## 2018-01-08 DIAGNOSIS — N040 Nephrotic syndrome with minor glomerular abnormality: Secondary | ICD-10-CM | POA: Diagnosis not present

## 2018-01-14 DIAGNOSIS — M87050 Idiopathic aseptic necrosis of pelvis: Secondary | ICD-10-CM | POA: Diagnosis not present

## 2018-01-14 DIAGNOSIS — M87052 Idiopathic aseptic necrosis of left femur: Secondary | ICD-10-CM | POA: Diagnosis not present

## 2018-01-14 DIAGNOSIS — M1612 Unilateral primary osteoarthritis, left hip: Secondary | ICD-10-CM | POA: Diagnosis not present

## 2018-03-11 ENCOUNTER — Ambulatory Visit: Payer: Self-pay | Admitting: Orthopedic Surgery

## 2018-03-19 ENCOUNTER — Ambulatory Visit: Payer: Self-pay | Admitting: Orthopedic Surgery

## 2018-03-19 NOTE — H&P (View-Only) (Signed)
TOTAL HIP ADMISSION H&P  Patient is admitted for left total hip arthroplasty.  Subjective:  Chief Complaint: left hip pain  HPI: Cole Richardson, 42 y.o. male, has a history of pain and functional disability in the left hip(s) due to AVN and patient has failed non-surgical conservative treatments for greater than 12 weeks to include NSAID's and/or analgesics, corticosteriod injections, flexibility and strengthening excercises, use of assistive devices, weight reduction as appropriate and activity modification.  Onset of symptoms was gradual starting 2 years ago with rapidlly worsening course since that time.The patient noted no past surgery on the left hip(s).  Patient currently rates pain in the left hip at 10 out of 10 with activity. Patient has night pain, worsening of pain with activity and weight bearing, trendelenberg gait, pain that interfers with activities of daily living and pain with passive range of motion. Patient has evidence of subchondral cysts, subchondral sclerosis and joint space narrowing by imaging studies. This condition presents safety issues increasing the risk of falls. This patient has had AVN of the hip.  There is no current active infection.  Patient Active Problem List   Diagnosis Date Noted  . 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 Minimal change disease     Past Surgical History:  Procedure Laterality Date  . NO PAST SURGERIES    . TOTAL HIP ARTHROPLASTY Right 09/30/2015   Procedure: RIGHT TOTAL HIP ARTHROPLASTY ANTERIOR APPROACH;  Surgeon: Samson Frederic, MD;  Location: [facility];  Service: Orthopedics;  Laterality: Right;  Requesting RNFA    Current Outpatient Medications  Medication Sig Dispense Refill Last Dose  . aspirin 81 MG tablet Take 1 tablet (81 mg total) by mouth 2 (two) times daily after a meal. 60 tablet 1   . docusate sodium (COLACE) 100 MG capsule Take 1 capsule (100 mg total) by mouth 2 (two)  times daily. 60 capsule 3   . HYDROmorphone (DILAUDID) 2 MG tablet Take 0.5-1 tablets (1-2 mg total) by mouth every 3 (three) hours as needed for severe pain. 90 tablet 0   . ondansetron (ZOFRAN) 4 MG tablet Take 1 tablet (4 mg total) by mouth every 6 (six) hours as needed for nausea. 20 tablet 0   . senna (SENOKOT) 8.6 MG TABS tablet Take 2 tablets (17.2 mg total) by mouth at bedtime. 60 each 3    No current facility-administered medications for this visit.    Allergies  Allergen Reactions  . Morphine And Related Nausea And Vomiting  . Norco [Hydrocodone-Acetaminophen] Nausea And Vomiting  . Nsaids Other (See Comments)    Minimal change disorder of kidney  . Percocet [Oxycodone-Acetaminophen] Nausea And Vomiting    Social History   Tobacco Use  . Smoking status: Never Smoker  . Smokeless tobacco: Never Used  Substance Use Topics  . Alcohol use: Yes    Comment: 1-2 beers a day    No family history on file.   Review of Systems  Constitutional: Negative.   HENT: Negative.   Eyes: Negative.   Respiratory: Negative.   Cardiovascular: Negative.   Gastrointestinal: Negative.   Genitourinary: Negative.   Musculoskeletal: Positive for joint pain.  Skin: Negative.   Neurological: Negative.   Endo/Heme/Allergies: Negative.   Psychiatric/Behavioral: Negative.     Objective:  Physical Exam  Vitals reviewed. Constitutional: He is oriented to person, place, and time. He appears well-developed and well-nourished.  HENT:  Head: Normocephalic and atraumatic.  Eyes: Pupils are  equal, round, and reactive to light. Conjunctivae and EOM are normal.  Neck: Normal range of motion. Neck supple.  Cardiovascular: Normal rate, regular rhythm and intact distal pulses.  Respiratory: Effort normal. No respiratory distress.  GI: Soft. He exhibits no distension.  Genitourinary:  Genitourinary Comments: deferred  Musculoskeletal:       Left hip: He exhibits decreased range of motion and bony  tenderness.  Neurological: He is alert and oriented to person, place, and time. He has normal reflexes.  Skin: Skin is warm and dry.  Psychiatric: He has a normal mood and affect. His behavior is normal. Thought content normal.    Vital signs in last 24 hours: @VSRANGES @  Labs:   Estimated body mass index is 35.28 kg/m as calculated from the following:   Height as of 09/30/15: 5\' 8"  (1.727 m).   Weight as of 09/30/15: 105.2 kg.   Imaging Review Plain radiographs demonstrate moderate degenerative joint disease of the left hip(s). The bone quality appears to be adequate for age and reported activity level.    Preoperative templating of the joint replacement has been completed, documented, and submitted to the Operating Room personnel in order to optimize intra-operative equipment management.     Assessment/Plan:  AVN, left hip(s)  The patient history, physical examination, clinical judgement of the provider and imaging studies are consistent with end stage degenerative joint disease of the left hip(s) and total hip arthroplasty is deemed medically necessary. The treatment options including medical management, injection therapy, arthroscopy and arthroplasty were discussed at length. The risks and benefits of total hip arthroplasty were presented and reviewed. The risks due to aseptic loosening, infection, stiffness, dislocation/subluxation,  thromboembolic complications and other imponderables were discussed.  The patient acknowledged the explanation, agreed to proceed with the plan and consent was signed. Patient is being admitted for inpatient treatment for surgery, pain control, PT, OT, prophylactic antibiotics, VTE prophylaxis, progressive ambulation and ADL's and discharge planning.The patient is planning to be discharged home with HEP\

## 2018-03-19 NOTE — H&P (Signed)
TOTAL HIP ADMISSION H&P  Patient is admitted for left total hip arthroplasty.  Subjective:  Chief Complaint: left hip pain  HPI: Cole Richardson, 42 y.o. male, has a history of pain and functional disability in the left hip(s) due to AVN and patient has failed non-surgical conservative treatments for greater than 12 weeks to include NSAID's and/or analgesics, corticosteriod injections, flexibility and strengthening excercises, use of assistive devices, weight reduction as appropriate and activity modification.  Onset of symptoms was gradual starting 2 years ago with rapidlly worsening course since that time.The patient noted no past surgery on the left hip(s).  Patient currently rates pain in the left hip at 10 out of 10 with activity. Patient has night pain, worsening of pain with activity and weight bearing, trendelenberg gait, pain that interfers with activities of daily living and pain with passive range of motion. Patient has evidence of subchondral cysts, subchondral sclerosis and joint space narrowing by imaging studies. This condition presents safety issues increasing the risk of falls. This patient has had AVN of the hip.  There is no current active infection.  Patient Active Problem List   Diagnosis Date Noted  . 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 Minimal change disease     Past Surgical History:  Procedure Laterality Date  . NO PAST SURGERIES    . TOTAL HIP ARTHROPLASTY Right 09/30/2015   Procedure: RIGHT TOTAL HIP ARTHROPLASTY ANTERIOR APPROACH;  Surgeon: Marsh Heckler, MD;  Location: [facility];  Service: Orthopedics;  Laterality: Right;  Requesting RNFA    Current Outpatient Medications  Medication Sig Dispense Refill Last Dose  . aspirin 81 MG tablet Take 1 tablet (81 mg total) by mouth 2 (two) times daily after a meal. 60 tablet 1   . docusate sodium (COLACE) 100 MG capsule Take 1 capsule (100 mg total) by mouth 2 (two)  times daily. 60 capsule 3   . HYDROmorphone (DILAUDID) 2 MG tablet Take 0.5-1 tablets (1-2 mg total) by mouth every 3 (three) hours as needed for severe pain. 90 tablet 0   . ondansetron (ZOFRAN) 4 MG tablet Take 1 tablet (4 mg total) by mouth every 6 (six) hours as needed for nausea. 20 tablet 0   . senna (SENOKOT) 8.6 MG TABS tablet Take 2 tablets (17.2 mg total) by mouth at bedtime. 60 each 3    No current facility-administered medications for this visit.    Allergies  Allergen Reactions  . Morphine And Related Nausea And Vomiting  . Norco [Hydrocodone-Acetaminophen] Nausea And Vomiting  . Nsaids Other (See Comments)    Minimal change disorder of kidney  . Percocet [Oxycodone-Acetaminophen] Nausea And Vomiting    Social History   Tobacco Use  . Smoking status: Never Smoker  . Smokeless tobacco: Never Used  Substance Use Topics  . Alcohol use: Yes    Comment: 1-2 beers a day    No family history on file.   Review of Systems  Constitutional: Negative.   HENT: Negative.   Eyes: Negative.   Respiratory: Negative.   Cardiovascular: Negative.   Gastrointestinal: Negative.   Genitourinary: Negative.   Musculoskeletal: Positive for joint pain.  Skin: Negative.   Neurological: Negative.   Endo/Heme/Allergies: Negative.   Psychiatric/Behavioral: Negative.     Objective:  Physical Exam  Vitals reviewed. Constitutional: He is oriented to person, place, and time. He appears well-developed and well-nourished.  HENT:  Head: Normocephalic and atraumatic.  Eyes: Pupils are   equal, round, and reactive to light. Conjunctivae and EOM are normal.  Neck: Normal range of motion. Neck supple.  Cardiovascular: Normal rate, regular rhythm and intact distal pulses.  Respiratory: Effort normal. No respiratory distress.  GI: Soft. He exhibits no distension.  Genitourinary:  Genitourinary Comments: deferred  Musculoskeletal:       Left hip: He exhibits decreased range of motion and bony  tenderness.  Neurological: He is alert and oriented to person, place, and time. He has normal reflexes.  Skin: Skin is warm and dry.  Psychiatric: He has a normal mood and affect. His behavior is normal. Thought content normal.    Vital signs in last 24 hours: @VSRANGES@  Labs:   Estimated body mass index is 35.28 kg/m as calculated from the following:   Height as of 09/30/15: 5' 8" (1.727 m).   Weight as of 09/30/15: 105.2 kg.   Imaging Review Plain radiographs demonstrate moderate degenerative joint disease of the left hip(s). The bone quality appears to be adequate for age and reported activity level.    Preoperative templating of the joint replacement has been completed, documented, and submitted to the Operating Room personnel in order to optimize intra-operative equipment management.     Assessment/Plan:  AVN, left hip(s)  The patient history, physical examination, clinical judgement of the provider and imaging studies are consistent with end stage degenerative joint disease of the left hip(s) and total hip arthroplasty is deemed medically necessary. The treatment options including medical management, injection therapy, arthroscopy and arthroplasty were discussed at length. The risks and benefits of total hip arthroplasty were presented and reviewed. The risks due to aseptic loosening, infection, stiffness, dislocation/subluxation,  thromboembolic complications and other imponderables were discussed.  The patient acknowledged the explanation, agreed to proceed with the plan and consent was signed. Patient is being admitted for inpatient treatment for surgery, pain control, PT, OT, prophylactic antibiotics, VTE prophylaxis, progressive ambulation and ADL's and discharge planning.The patient is planning to be discharged home with HEP\ 

## 2018-03-27 NOTE — Patient Instructions (Addendum)
Cole OysterRoderick A Richardson  03/27/2018   Your procedure is scheduled on: 04-10-18  Report to [facility] Main  Entrance              Report to admitting at       0920  AM    Call this number if you have problems the morning of surgery [phone]    Remember: Do not eat food or drink liquids :After Midnight.   BRUSH YOUR TEETH MORNING OF SURGERY AND RINSE YOUR MOUTH OUT, NO CHEWING GUM CANDY OR MINTS.     Take these medicines the morning of surgery with A SIP OF WATER:                                  You may not have any metal on your body including hair pins and              piercings  Do not wear jewelry,lotions, powders or perfumes, deodorant                   Men may shave face and neck.   Do not bring valuables to the hospital.  IS NOT             RESPONSIBLE   FOR VALUABLES.  Contacts, dentures or bridgework may not be worn into surgery.  Leave suitcase in the car. After surgery it may be brought to your room.                  Please read over the following fact sheets you were given: _____________________________________________________________________       [facility] Preparing [facility] Before surgery, you can play an important role.  Because skin is not sterile, your skin needs to be as free of germs as possible.  You can reduce the number of germs on your skin by washing with CHG (chlorahexidine gluconate) soap before surgery.  CHG is an antiseptic cleaner which kills germs and bonds with the skin to continue killing germs even after washing. Please DO NOT use if you have an allergy to CHG or antibacterial soaps.  If your skin becomes reddened/irritated stop using the CHG and inform your nurse when you arrive at Short Stay. Do not shave (including legs and underarms) for at least 48 hours prior to the first CHG shower.  You may shave your face/neck. Please follow these instructions carefully:  1.  Shower with CHG Soap the night  before surgery and the  morning of Surgery.  2.  If you choose to wash your hair, wash your hair first as usual with your  normal  shampoo.  3.  After you shampoo, rinse your hair and body thoroughly to remove the  shampoo.                           4.  Use CHG as you would any other liquid soap.  You can apply chg directly  to the skin and wash                       Gently with a scrungie or clean washcloth.  5.  Apply the CHG Soap to your body ONLY FROM THE NECK DOWN.   Do not use on face/  open                           Wound or open sores. Avoid contact with eyes, ears mouth and genitals (private parts).                       Wash face,  Genitals (private parts) with your normal soap.             6.  Wash thoroughly, paying special attention to the area where your surgery  will be performed.  7.  Thoroughly rinse your body with warm water from the neck down.  8.  DO NOT shower/wash with your normal soap after using and rinsing off  the CHG Soap.                9.  Pat yourself dry with a clean towel.            10.  Wear clean pajamas.            11.  Place clean sheets on your bed the night of your first shower and do not  sleep with pets. Day of Surgery : Do not apply any lotions/deodorants the morning of surgery.  Please wear clean clothes to the hospital/surgery center.  FAILURE TO FOLLOW THESE INSTRUCTIONS MAY RESULT IN THE CANCELLATION OF YOUR SURGERY PATIENT SIGNATURE_________________________________  NURSE SIGNATURE__________________________________  ________________________________________________________________________  WHAT IS A BLOOD TRANSFUSION? Blood Transfusion Information  A transfusion is the replacement of blood or some of its parts. Blood is made up of multiple cells which provide different functions.  Red blood cells carry oxygen and are used for blood loss replacement.  White blood cells fight against infection.  Platelets control bleeding.  Plasma helps clot  blood.  Other blood products are available for specialized needs, such as hemophilia or other clotting disorders. BEFORE THE TRANSFUSION  Who gives blood for transfusions?   Healthy volunteers who are fully evaluated to make sure their blood is safe. This is blood bank blood. Transfusion therapy is the safest it has ever been in the practice of medicine. Before blood is taken from a donor, a complete history is taken to make sure that person has no history of diseases nor engages in risky social behavior (examples are intravenous drug use or sexual activity with multiple partners). The donor's travel history is screened to minimize risk of transmitting infections, such as malaria. The donated blood is tested for signs of infectious diseases, such as HIV and hepatitis. The blood is then tested to be sure it is compatible with you in order to minimize the chance of a transfusion reaction. If you or a relative donates blood, this is often done in anticipation of surgery and is not appropriate for emergency situations. It takes many days to process the donated blood. RISKS AND COMPLICATIONS Although transfusion therapy is very safe and saves many lives, the main dangers of transfusion include:   Getting an infectious disease.  Developing a transfusion reaction. This is an allergic reaction to something in the blood you were given. Every precaution is taken to prevent this. The decision to have a blood transfusion has been considered carefully by your caregiver before blood is given. Blood is not given unless the benefits outweigh the risks. AFTER THE TRANSFUSION  Right after receiving a blood transfusion, you will usually feel much better and more energetic. This is especially true if your red blood  cells have gotten low (anemic). The transfusion raises the level of the red blood cells which carry oxygen, and this usually causes an energy increase.  The nurse administering the transfusion will monitor  you carefully for complications. HOME CARE INSTRUCTIONS  No special instructions are needed after a transfusion. You may find your energy is better. Speak with your caregiver about any limitations on activity for underlying diseases you may have. SEEK MEDICAL CARE IF:   Your condition is not improving after your transfusion.  You develop redness or irritation at the intravenous (IV) site. SEEK IMMEDIATE MEDICAL CARE IF:  Any of the following symptoms occur over the next 12 hours:  Shaking chills.  You have a temperature by mouth above 102 F (38.9 C), not controlled by medicine.  Chest, back, or muscle pain.  People around you feel you are not acting correctly or are confused.  Shortness of breath or difficulty breathing.  Dizziness and fainting.  You get a rash or develop hives.  You have a decrease in urine output.  Your urine turns a dark color or changes to pink, red, or brown. Any of the following symptoms occur over the next 10 days:  You have a temperature by mouth above 102 F (38.9 C), not controlled by medicine.  Shortness of breath.  Weakness after normal activity.  The white part of the eye turns yellow (jaundice).  You have a decrease in the amount of urine or are urinating less often.  Your urine turns a dark color or changes to pink, red, or brown. Document Released: 04/07/2000 Document Revised: 07/03/2011 Document Reviewed: 11/25/2007 Exit[employer] Patient Information 2014 [place].  _______________________________________________________________________  Incentive Spirometer  An incentive spirometer is a tool that can help keep your lungs clear and active. This tool measures how well you are filling your lungs with each breath. Taking long deep breaths may help reverse or decrease the chance of developing breathing (pulmonary) problems (especially infection) following:  A long period of time when you are unable to move or be active. BEFORE THE  PROCEDURE   If the spirometer includes an indicator to show your best effort, your nurse or respiratory therapist will set it to a desired goal.  If possible, sit up straight or lean slightly forward. Try not to slouch.  Hold the incentive spirometer in an upright position. INSTRUCTIONS FOR USE  1. Sit on the edge of your bed if possible, or sit up as far as you can in bed or on a chair. 2. Hold the incentive spirometer in an upright position. 3. Breathe out normally. 4. Place the mouthpiece in your mouth and seal your lips tightly around it. 5. Breathe in slowly and as deeply as possible, raising the piston or the ball toward the top of the column. 6. Hold your breath for 3-5 seconds or for as long as possible. Allow the piston or ball to fall to the bottom of the column. 7. Remove the mouthpiece from your mouth and breathe out normally. 8. Rest for a few seconds and repeat Steps 1 through 7 at least 10 times every 1-2 hours when you are awake. Take your time and take a few normal breaths between deep breaths. 9. The spirometer may include an indicator to show your best effort. Use the indicator as a goal to work toward during each repetition. 10. After each set of 10 deep breaths, practice coughing to be sure your lungs are clear. If you have an incision (the cut made  at the time of surgery), support your incision when coughing by placing a pillow or rolled up towels firmly against it. Once you are able to get out of bed, walk around indoors and cough well. You may stop using the incentive spirometer when instructed by your caregiver.  RISKS AND COMPLICATIONS  Take your time so you do not get dizzy or light-headed.  If you are in pain, you may need to take or ask for pain medication before doing incentive spirometry. It is harder to take a deep breath if you are having pain. AFTER USE  Rest and breathe slowly and easily.  It can be helpful to keep track of a log of your progress. Your  caregiver can provide you with a simple table to help with this. If you are using the spirometer at home, follow these instructions: [place] IF:   You are having difficultly using the spirometer.  You have trouble using the spirometer as often as instructed.  Your pain medication is not giving enough relief while using the spirometer.  You develop fever of 100.5 F (38.1 C) or higher. SEEK IMMEDIATE MEDICAL CARE IF:   You cough up bloody sputum that had not been present before.  You develop fever of 102 F (38.9 C) or greater.  You develop worsening pain at or near the incision site. MAKE SURE YOU:   Understand these instructions.  Will watch your condition.  Will get help right away if you are not doing well or get worse. Document Released: 08/21/2006 Document Revised: 07/03/2011 Document Reviewed: 10/22/2006 [facility] Patient Information 2014 [place], [place].   ________________________________________________________________________

## 2018-03-27 NOTE — Progress Notes (Signed)
ekg 02-01-18 on chart  Clearance 02-01-18 Lonie PeakNathan Conroy PA-C on chart

## 2018-04-03 ENCOUNTER — Other Ambulatory Visit: Payer: Self-pay

## 2018-04-03 ENCOUNTER — Encounter (HOSPITAL_COMMUNITY): Payer: Self-pay

## 2018-04-03 ENCOUNTER — Encounter (HOSPITAL_COMMUNITY)
Admission: RE | Admit: 2018-04-03 | Discharge: 2018-04-03 | Disposition: A | Discharge status: Home / Self Care | Payer: 59 | Source: Ambulatory Visit | Attending: Orthopedic Surgery | Admitting: Orthopedic Surgery

## 2018-04-03 DIAGNOSIS — M1612 Unilateral primary osteoarthritis, left hip: Secondary | ICD-10-CM | POA: Insufficient documentation

## 2018-04-03 DIAGNOSIS — Z01818 Encounter for other preprocedural examination: Secondary | ICD-10-CM | POA: Diagnosis not present

## 2018-04-03 DIAGNOSIS — M9050 Osteonecrosis in diseases classified elsewhere, unspecified site: Secondary | ICD-10-CM | POA: Diagnosis not present

## 2018-04-03 HISTORY — DX: Unspecified osteoarthritis, unspecified site: M19.90

## 2018-04-03 LAB — CBC
HCT: 43.9 % (ref 39.0–52.0)
Hemoglobin: 13.8 g/dL (ref 13.0–17.0)
MCH: 29.2 pg (ref 26.0–34.0)
MCHC: 31.4 g/dL (ref 30.0–36.0)
MCV: 93 fL (ref 80.0–100.0)
Platelets: 147 10*3/uL — ABNORMAL LOW (ref 150–400)
RBC: 4.72 MIL/uL (ref 4.22–5.81)
RDW: 13 % (ref 11.5–15.5)
WBC: 9.2 10*3/uL (ref 4.0–10.5)
nRBC: 0 % (ref 0.0–0.2)

## 2018-04-03 LAB — BASIC METABOLIC PANEL
ANION GAP: 8 (ref 5–15)
BUN: 17 mg/dL (ref 6–20)
CO2: 25 mmol/L (ref 22–32)
Calcium: 8.9 mg/dL (ref 8.9–10.3)
Chloride: 106 mmol/L (ref 98–111)
Creatinine, Ser: 1.15 mg/dL (ref 0.61–1.24)
GFR calc Af Amer: >60 mL/min (ref >60)
GFR calc non Af Amer: >60 mL/min (ref >60)
Glucose, Bld: 93 mg/dL (ref 70–99)
Potassium: 4.5 mmol/L (ref 3.5–5.1)
Sodium: 139 mmol/L (ref 135–145)

## 2018-04-03 LAB — SURGICAL PCR SCREEN
MRSA, PCR: NEGATIVE
Staphylococcus aureus: NEGATIVE

## 2018-04-10 ENCOUNTER — Inpatient Hospital Stay (HOSPITAL_COMMUNITY): Payer: 59

## 2018-04-10 ENCOUNTER — Other Ambulatory Visit: Payer: Self-pay

## 2018-04-10 ENCOUNTER — Encounter (HOSPITAL_COMMUNITY): Payer: Self-pay | Admitting: Emergency Medicine

## 2018-04-10 ENCOUNTER — Inpatient Hospital Stay (HOSPITAL_COMMUNITY): Payer: 59 | Admitting: Anesthesiology

## 2018-04-10 ENCOUNTER — Encounter (HOSPITAL_COMMUNITY)
Admission: RE | Disposition: A | Discharge status: Home Health | Payer: Self-pay | Source: Home / Self Care | Attending: Orthopedic Surgery

## 2018-04-10 ENCOUNTER — Inpatient Hospital Stay (HOSPITAL_COMMUNITY)
Admission: RE | Admit: 2018-04-10 | Discharge: 2018-04-12 | DRG: 470 | Disposition: A | Discharge status: Home Health | Payer: 59 | Attending: Orthopedic Surgery | Admitting: Orthopedic Surgery

## 2018-04-10 DIAGNOSIS — M87052 Idiopathic aseptic necrosis of left femur: Secondary | ICD-10-CM

## 2018-04-10 DIAGNOSIS — M87852 Other osteonecrosis, left femur: Principal | ICD-10-CM | POA: Diagnosis present

## 2018-04-10 DIAGNOSIS — Z79899 Other long term (current) drug therapy: Secondary | ICD-10-CM

## 2018-04-10 DIAGNOSIS — Z7982 Long term (current) use of aspirin: Secondary | ICD-10-CM | POA: Diagnosis not present

## 2018-04-10 DIAGNOSIS — Z886 Allergy status to analgesic agent status: Secondary | ICD-10-CM

## 2018-04-10 DIAGNOSIS — Z885 Allergy status to narcotic agent status: Secondary | ICD-10-CM

## 2018-04-10 DIAGNOSIS — Z96641 Presence of right artificial hip joint: Secondary | ICD-10-CM | POA: Diagnosis present

## 2018-04-10 DIAGNOSIS — R202 Paresthesia of skin: Secondary | ICD-10-CM | POA: Diagnosis not present

## 2018-04-10 DIAGNOSIS — M1612 Unilateral primary osteoarthritis, left hip: Secondary | ICD-10-CM | POA: Diagnosis present

## 2018-04-10 DIAGNOSIS — Z09 Encounter for follow-up examination after completed treatment for conditions other than malignant neoplasm: Secondary | ICD-10-CM

## 2018-04-10 DIAGNOSIS — Z419 Encounter for procedure for purposes other than remedying health state, unspecified: Secondary | ICD-10-CM

## 2018-04-10 HISTORY — PX: TOTAL HIP ARTHROPLASTY: SHX124

## 2018-04-10 LAB — TYPE AND SCREEN
ABO/RH(D): O POS
Antibody Screen: NEGATIVE

## 2018-04-10 SURGERY — ARTHROPLASTY, HIP, TOTAL, ANTERIOR APPROACH
Anesthesia: Spinal | Laterality: Left

## 2018-04-10 MED ORDER — ASPIRIN 81 MG PO CHEW
81.0000 mg | CHEWABLE_TABLET | Freq: Two times a day (BID) | ORAL | Status: DC
  Administered 2018-04-10 – 2018-04-12 (×4): 81 mg via ORAL
  Filled 2018-04-10 (×4): qty 1

## 2018-04-10 MED ORDER — PROPOFOL 10 MG/ML IV BOLUS
INTRAVENOUS | Status: AC
  Filled 2018-04-10: qty 60

## 2018-04-10 MED ORDER — BUPIVACAINE IN DEXTROSE 0.75-8.25 % IT SOLN
INTRATHECAL | Status: DC | PRN
  Administered 2018-04-10: 1.9 mL via INTRATHECAL

## 2018-04-10 MED ORDER — ACETAMINOPHEN 325 MG PO TABS
325.0000 mg | ORAL_TABLET | Freq: Four times a day (QID) | ORAL | Status: DC | PRN
  Administered 2018-04-10: 650 mg via ORAL
  Filled 2018-04-10 (×2): qty 2

## 2018-04-10 MED ORDER — PROPOFOL 10 MG/ML IV BOLUS
INTRAVENOUS | Status: AC
  Filled 2018-04-10: qty 20

## 2018-04-10 MED ORDER — HYDROCODONE-ACETAMINOPHEN 7.5-325 MG PO TABS: 1.0000 | ORAL_TABLET | ORAL | Status: DC | PRN

## 2018-04-10 MED ORDER — HYDROCODONE-ACETAMINOPHEN 5-325 MG PO TABS: 1.0000 | ORAL_TABLET | ORAL | Status: DC | PRN

## 2018-04-10 MED ORDER — SODIUM CHLORIDE (PF) 0.9 % IJ SOLN
INTRAMUSCULAR | Status: AC
  Filled 2018-04-10: qty 50

## 2018-04-10 MED ORDER — KETOROLAC TROMETHAMINE 30 MG/ML IJ SOLN
INTRAMUSCULAR | Status: DC | PRN
  Administered 2018-04-10: 30 mg via INTRAVENOUS

## 2018-04-10 MED ORDER — CEFAZOLIN SODIUM-DEXTROSE 2-4 GM/100ML-% IV SOLN
2.0000 g | Freq: Four times a day (QID) | INTRAVENOUS | Status: AC
  Administered 2018-04-10 (×2): 2 g via INTRAVENOUS
  Filled 2018-04-10 (×2): qty 100

## 2018-04-10 MED ORDER — PROPOFOL 500 MG/50ML IV EMUL
INTRAVENOUS | Status: DC | PRN
  Administered 2018-04-10: 100 ug/kg/min via INTRAVENOUS

## 2018-04-10 MED ORDER — SCOPOLAMINE 1 MG/3DAYS TD PT72
MEDICATED_PATCH | TRANSDERMAL | Status: AC
  Filled 2018-04-10: qty 1

## 2018-04-10 MED ORDER — MORPHINE SULFATE (PF) 4 MG/ML IV SOLN: 0.5000 mg | INTRAVENOUS | Status: DC | PRN

## 2018-04-10 MED ORDER — SCOPOLAMINE 1 MG/3DAYS TD PT72
MEDICATED_PATCH | TRANSDERMAL | Status: DC | PRN
  Administered 2018-04-10 (×2): 1 via TRANSDERMAL

## 2018-04-10 MED ORDER — LOSARTAN POTASSIUM 25 MG PO TABS
25.0000 mg | ORAL_TABLET | Freq: Two times a day (BID) | ORAL | Status: DC
  Administered 2018-04-11 – 2018-04-12 (×2): 25 mg via ORAL
  Filled 2018-04-10 (×4): qty 1

## 2018-04-10 MED ORDER — METOCLOPRAMIDE HCL 5 MG PO TABS: 5.0000 mg | ORAL_TABLET | Freq: Three times a day (TID) | ORAL | Status: DC | PRN

## 2018-04-10 MED ORDER — DEXAMETHASONE SODIUM PHOSPHATE 4 MG/ML IJ SOLN
INTRAMUSCULAR | Status: DC | PRN
  Administered 2018-04-10: 4 mg via INTRAVENOUS

## 2018-04-10 MED ORDER — ISOPROPYL ALCOHOL 70 % SOLN
Status: AC
  Filled 2018-04-10: qty 480

## 2018-04-10 MED ORDER — TRANEXAMIC ACID-NACL 1000-0.7 MG/100ML-% IV SOLN
1000.0000 mg | INTRAVENOUS | Status: AC
  Administered 2018-04-10: 1000 mg via INTRAVENOUS
  Filled 2018-04-10: qty 100

## 2018-04-10 MED ORDER — ONDANSETRON HCL 4 MG/2ML IJ SOLN: 4.0000 mg | Freq: Four times a day (QID) | INTRAMUSCULAR | Status: DC | PRN

## 2018-04-10 MED ORDER — LACTATED RINGERS IV SOLN
INTRAVENOUS | Status: DC
  Administered 2018-04-10 (×2): via INTRAVENOUS

## 2018-04-10 MED ORDER — METOCLOPRAMIDE HCL 5 MG/ML IJ SOLN: 5.0000 mg | Freq: Three times a day (TID) | INTRAMUSCULAR | Status: DC | PRN

## 2018-04-10 MED ORDER — SODIUM CHLORIDE 0.9 % IV SOLN
INTRAVENOUS | Status: DC
  Administered 2018-04-10 – 2018-04-11 (×2): via INTRAVENOUS

## 2018-04-10 MED ORDER — MIDAZOLAM HCL 2 MG/2ML IJ SOLN
INTRAMUSCULAR | Status: AC
  Filled 2018-04-10: qty 2

## 2018-04-10 MED ORDER — CYCLOSPORINE MODIFIED (NEORAL) 100 MG PO CAPS
100.0000 mg | ORAL_CAPSULE | Freq: Two times a day (BID) | ORAL | Status: DC
  Administered 2018-04-10 – 2018-04-12 (×4): 100 mg via ORAL
  Filled 2018-04-10 (×4): qty 1

## 2018-04-10 MED ORDER — FENTANYL CITRATE (PF) 100 MCG/2ML IJ SOLN
INTRAMUSCULAR | Status: DC | PRN
  Administered 2018-04-10 (×2): 50 ug via INTRAVENOUS
  Administered 2018-04-10: 4 ug via INTRAVENOUS

## 2018-04-10 MED ORDER — SODIUM CHLORIDE 0.9 % IV SOLN: INTRAVENOUS | Status: DC

## 2018-04-10 MED ORDER — METHOCARBAMOL 500 MG PO TABS
500.0000 mg | ORAL_TABLET | Freq: Four times a day (QID) | ORAL | Status: DC | PRN
  Administered 2018-04-10 – 2018-04-12 (×4): 500 mg via ORAL
  Filled 2018-04-10 (×5): qty 1

## 2018-04-10 MED ORDER — ACETAMINOPHEN 10 MG/ML IV SOLN
1000.0000 mg | INTRAVENOUS | Status: AC
  Administered 2018-04-10: 1000 mg via INTRAVENOUS
  Filled 2018-04-10: qty 100

## 2018-04-10 MED ORDER — CHLORHEXIDINE GLUCONATE 4 % EX LIQD: 60.0000 mL | Freq: Once | CUTANEOUS | Status: DC

## 2018-04-10 MED ORDER — DEXAMETHASONE SODIUM PHOSPHATE 10 MG/ML IJ SOLN
10.0000 mg | Freq: Once | INTRAMUSCULAR | Status: AC
  Administered 2018-04-11: 10 mg via INTRAVENOUS
  Filled 2018-04-10: qty 1

## 2018-04-10 MED ORDER — FENTANYL CITRATE (PF) 100 MCG/2ML IJ SOLN
INTRAMUSCULAR | Status: AC
  Filled 2018-04-10: qty 2

## 2018-04-10 MED ORDER — METHOCARBAMOL 500 MG IVPB - SIMPLE MED
500.0000 mg | Freq: Four times a day (QID) | INTRAVENOUS | Status: DC | PRN
  Filled 2018-04-10: qty 50

## 2018-04-10 MED ORDER — ISOPROPYL ALCOHOL 70 % SOLN
Status: DC | PRN
  Administered 2018-04-10: 1 via TOPICAL

## 2018-04-10 MED ORDER — FUROSEMIDE 20 MG PO TABS: 20.0000 mg | ORAL_TABLET | Freq: Every day | ORAL | Status: DC | PRN

## 2018-04-10 MED ORDER — MIDAZOLAM HCL 5 MG/5ML IJ SOLN
INTRAMUSCULAR | Status: DC | PRN
  Administered 2018-04-10: 2 mg via INTRAVENOUS

## 2018-04-10 MED ORDER — POVIDONE-IODINE 10 % EX SWAB
2.0000 "application " | Freq: Once | CUTANEOUS | Status: AC
  Administered 2018-04-10: 2 via TOPICAL

## 2018-04-10 MED ORDER — DIPHENHYDRAMINE HCL 12.5 MG/5ML PO ELIX: 12.5000 mg | ORAL_SOLUTION | ORAL | Status: DC | PRN

## 2018-04-10 MED ORDER — HYDROMORPHONE HCL 1 MG/ML IJ SOLN
0.5000 mg | INTRAMUSCULAR | Status: DC | PRN
  Administered 2018-04-10: 0.5 mg via INTRAVENOUS
  Filled 2018-04-10: qty 1

## 2018-04-10 MED ORDER — MENTHOL 3 MG MT LOZG: 1.0000 | LOZENGE | OROMUCOSAL | Status: DC | PRN

## 2018-04-10 MED ORDER — BUPIVACAINE-EPINEPHRINE 0.25% -1:200000 IJ SOLN
INTRAMUSCULAR | Status: DC | PRN
  Administered 2018-04-10: 30 mL

## 2018-04-10 MED ORDER — DOCUSATE SODIUM 100 MG PO CAPS
100.0000 mg | ORAL_CAPSULE | Freq: Two times a day (BID) | ORAL | Status: DC
  Administered 2018-04-10 – 2018-04-12 (×4): 100 mg via ORAL
  Filled 2018-04-10 (×4): qty 1

## 2018-04-10 MED ORDER — FENTANYL CITRATE (PF) 100 MCG/2ML IJ SOLN: 25.0000 ug | INTRAMUSCULAR | Status: DC | PRN

## 2018-04-10 MED ORDER — ONDANSETRON HCL 4 MG PO TABS: 4.0000 mg | ORAL_TABLET | Freq: Four times a day (QID) | ORAL | Status: DC | PRN

## 2018-04-10 MED ORDER — SODIUM CHLORIDE 0.9 % IR SOLN
Status: DC | PRN
  Administered 2018-04-10: 3000 mL
  Administered 2018-04-10: 1000 mL

## 2018-04-10 MED ORDER — KETOROLAC TROMETHAMINE 30 MG/ML IJ SOLN
INTRAMUSCULAR | Status: AC
  Filled 2018-04-10: qty 1

## 2018-04-10 MED ORDER — ALUM & MAG HYDROXIDE-SIMETH 200-200-20 MG/5ML PO SUSP: 30.0000 mL | ORAL | Status: DC | PRN

## 2018-04-10 MED ORDER — PHENOL 1.4 % MT LIQD: 1.0000 | OROMUCOSAL | Status: DC | PRN

## 2018-04-10 MED ORDER — CEFAZOLIN SODIUM-DEXTROSE 2-4 GM/100ML-% IV SOLN
2.0000 g | INTRAVENOUS | Status: AC
  Administered 2018-04-10: 2 g via INTRAVENOUS
  Filled 2018-04-10: qty 100

## 2018-04-10 MED ORDER — SODIUM CHLORIDE (PF) 0.9 % IJ SOLN
INTRAMUSCULAR | Status: DC | PRN
  Administered 2018-04-10: 50 mL via INTRAVENOUS

## 2018-04-10 MED ORDER — SENNA 8.6 MG PO TABS
1.0000 | ORAL_TABLET | Freq: Two times a day (BID) | ORAL | Status: DC
  Administered 2018-04-10 – 2018-04-12 (×4): 8.6 mg via ORAL
  Filled 2018-04-10 (×4): qty 1

## 2018-04-10 MED ORDER — POLYETHYLENE GLYCOL 3350 17 G PO PACK: 17.0000 g | PACK | Freq: Every day | ORAL | Status: DC | PRN

## 2018-04-10 MED ORDER — HYDROMORPHONE HCL 2 MG PO TABS
2.0000 mg | ORAL_TABLET | ORAL | Status: DC | PRN
  Administered 2018-04-10: 2 mg via ORAL
  Administered 2018-04-10 – 2018-04-12 (×8): 4 mg via ORAL
  Filled 2018-04-10: qty 1
  Filled 2018-04-10 (×8): qty 2

## 2018-04-10 MED ORDER — BUPIVACAINE-EPINEPHRINE (PF) 0.25% -1:200000 IJ SOLN
INTRAMUSCULAR | Status: AC
  Filled 2018-04-10: qty 30

## 2018-04-10 SURGICAL SUPPLY — 54 items
ADH SKN CLS APL DERMABOND .7 (GAUZE/BANDAGES/DRESSINGS) ×1
BAG DECANTER FOR FLEXI CONT (MISCELLANEOUS) IMPLANT
BAG SPEC THK2 15X12 ZIP CLS (MISCELLANEOUS)
BAG ZIPLOCK 12X15 (MISCELLANEOUS) IMPLANT
BLADE SURG SZ10 CARB STEEL (BLADE) ×4 IMPLANT
CHLORAPREP W/TINT 26ML (MISCELLANEOUS) ×3 IMPLANT
CLOTH BEACON ORANGE TIMEOUT ST (SAFETY) ×2 IMPLANT
COVER PERINEAL POST (MISCELLANEOUS) ×2 IMPLANT
COVER SURGICAL LIGHT HANDLE (MISCELLANEOUS) ×2 IMPLANT
COVER WAND RF STERILE (DRAPES) IMPLANT
CUP ACET PNNCL SECTR W/GRIP 56 (Hips) IMPLANT
DECANTER SPIKE VIAL GLASS SM (MISCELLANEOUS) ×2 IMPLANT
DERMABOND ADVANCED (GAUZE/BANDAGES/DRESSINGS) ×1
DERMABOND ADVANCED .7 DNX12 (GAUZE/BANDAGES/DRESSINGS) ×2 IMPLANT
DRAPE IMP U-DRAPE 54X76 (DRAPES) ×2 IMPLANT
DRAPE SHEET LG 3/4 BI-LAMINATE (DRAPES) ×6 IMPLANT
DRAPE STERI IOBAN 125X83 (DRAPES) ×2 IMPLANT
DRAPE U-SHAPE 47X51 STRL (DRAPES) ×4 IMPLANT
DRSG AQUACEL AG ADV 3.5X10 (GAUZE/BANDAGES/DRESSINGS) ×2 IMPLANT
ELECT PENCIL ROCKER SW 15FT (MISCELLANEOUS) ×2 IMPLANT
ELECT REM PT RETURN 15FT ADLT (MISCELLANEOUS) ×2 IMPLANT
GAUZE SPONGE 4X4 12PLY STRL (GAUZE/BANDAGES/DRESSINGS) ×2 IMPLANT
GLOVE BIO SURGEON STRL SZ8.5 (GLOVE) ×4 IMPLANT
GLOVE BIOGEL PI IND STRL 8.5 (GLOVE) ×1 IMPLANT
GLOVE BIOGEL PI INDICATOR 8.5 (GLOVE) ×1
GOWN SPEC L3 XXLG W/TWL (GOWN DISPOSABLE) ×2 IMPLANT
HANDPIECE INTERPULSE COAX TIP (DISPOSABLE) ×2
HEAD CERAMIC 36 PLUS5 (Hips) ×1 IMPLANT
HOLDER FOLEY CATH W/STRAP (MISCELLANEOUS) ×2 IMPLANT
HOOD PEEL AWAY FLYTE STAYCOOL (MISCELLANEOUS) ×8 IMPLANT
MARKER SKIN DUAL TIP RULER LAB (MISCELLANEOUS) ×2 IMPLANT
NDL SPNL 18GX3.5 QUINCKE PK (NEEDLE) ×1 IMPLANT
NEEDLE SPNL 18GX3.5 QUINCKE PK (NEEDLE) ×2 IMPLANT
PACK ANTERIOR HIP CUSTOM (KITS) ×2 IMPLANT
PINN SECTOR W/GRIP ACE CUP 56 (Hips) ×2 IMPLANT
PINNACLE ALTRX PLUS 4 N 36X56 (Hips) ×1 IMPLANT
SAW OSC TIP CART 19.5X105X1.3 (SAW) ×2 IMPLANT
SEALER BIPOLAR AQUA 6.0 (INSTRUMENTS) ×2 IMPLANT
SET HNDPC FAN SPRY TIP SCT (DISPOSABLE) ×1 IMPLANT
STEM TRI LOC GRIPTION SZ 6 STD IMPLANT
SUT ETHIBOND NAB CT1 #1 30IN (SUTURE) ×4 IMPLANT
SUT MNCRL AB 3-0 PS2 18 (SUTURE) ×2 IMPLANT
SUT MNCRL AB 4-0 PS2 18 (SUTURE) ×2 IMPLANT
SUT MON AB 2-0 CT1 36 (SUTURE) ×4 IMPLANT
SUT STRATAFIX PDO 1 14 VIOLET (SUTURE) ×2
SUT STRATFX PDO 1 14 VIOLET (SUTURE) ×1
SUT VIC AB 2-0 CT1 27 (SUTURE) ×2
SUT VIC AB 2-0 CT1 TAPERPNT 27 (SUTURE) ×1 IMPLANT
SUTURE STRATFX PDO 1 14 VIOLET (SUTURE) ×1 IMPLANT
SYR 50ML LL SCALE MARK (SYRINGE) ×2 IMPLANT
TRAY FOLEY MTR SLVR 16FR STAT (SET/KITS/TRAYS/PACK) IMPLANT
TRI LOC GRIPTION SZ 6 STD ×2 IMPLANT
WATER STERILE IRR 1000ML POUR (IV SOLUTION) ×2 IMPLANT
YANKAUER SUCT BULB TIP 10FT TU (MISCELLANEOUS) ×2 IMPLANT

## 2018-04-10 NOTE — Transfer of Care (Signed)
Immediate Anesthesia Transfer of Care Note  Patient: Cole Richardson  Procedure(s) Performed: TOTAL HIP ARTHROPLASTY ANTERIOR APPROACH (Left )  Patient Location: PACU  Anesthesia Type:Spinal  Level of Consciousness: awake, alert  and oriented  Airway & Oxygen Therapy: Patient Spontanous Breathing and Patient connected to face mask oxygen  Post-op Assessment: Report given to RN and Post -op Vital signs reviewed and stable  Post vital signs: Reviewed and stable  Last Vitals:  Vitals Value Taken Time  BP 122/77 04/10/2018 11:26 AM  Temp    Pulse 61 04/10/2018 11:28 AM  Resp 21 04/10/2018 11:28 AM  SpO2 100 % 04/10/2018 11:28 AM  Vitals shown include unvalidated device data.  Last Pain:  Vitals:   04/10/18 0713  TempSrc:   PainSc: 0-No pain      Patients Stated Pain Goal: 4 (04/10/18 0713)  Complications: No apparent anesthesia complications

## 2018-04-10 NOTE — Op Note (Signed)
OPERATIVE REPORT  SURGEON: Rod Can, MD   ASSISTANT: Nehemiah Massed, PA-C.  PREOPERATIVE DIAGNOSIS: Left hip avascular necrosis.   POSTOPERATIVE DIAGNOSIS: Left hip avascular necrosis.   PROCEDURE: Left total hip arthroplasty, anterior approach.   IMPLANTS: DePuy Tri Lock stem, size 6, std offset. DePuy Pinnacle Cup, size 56 mm. DePuy Altrx liner, size 36 by 56 mm, +4 neutral. DePuy Biolox ceramic head ball, size 36 + 5 mm.  ANESTHESIA:  MAC and Spinal  ESTIMATED BLOOD LOSS:-350 mL    ANTIBIOTICS: 2 g Ancef.  DRAINS: None.  COMPLICATIONS: None.   CONDITION: PACU - hemodynamically stable.   BRIEF CLINICAL NOTE: Cole Richardson is a 42 y.o. male with a long-standing history of Left hip arthritis. After failing conservative management, the patient was indicated for total hip arthroplasty. The risks, benefits, and alternatives to the procedure were explained, and the patient elected to proceed.  PROCEDURE IN DETAIL: Surgical site was marked by myself in the pre-op holding area. Once inside the operating room, spinal anesthesia was obtained, and a foley catheter was inserted. The patient was then positioned on the Hana table. All bony prominences were well padded. The hip was prepped and draped in the normal sterile surgical fashion. A time-out was called verifying side and site of surgery. The patient received IV antibiotics within 60 minutes of beginning the procedure.  The direct anterior approach to the hip was performed through the Hueter interval. Lateral femoral circumflex vessels were treated with the Auqumantys. The anterior capsule was exposed and an inverted T capsulotomy was made.The femoral neck cut was made to the level of the templated cut. A corkscrew was placed into the head and the head was removed. The femoral head was found to have eburnated bone. The head was passed to the back table and was measured.  Acetabular exposure was achieved, and  the pulvinar and labrum were excised. Sequential reaming of the acetabulum was then performed up to a size 55 mm reamer. A 56 mm cup was then opened and impacted into place at approximately 40 degrees of abduction and 20 degrees of anteversion. The final polyethylene liner was impacted into place and acetabular osteophytes were removed.   I then gained femoral exposure taking care to protect the abductors and greater trochanter. This was performed using standard external rotation, extension, and adduction. The capsule was peeled off the inner aspect of the greater trochanter, taking care to preserve the short external rotators. A cookie cutter was used to enter the femoral canal, and then the femoral canal finder was placed. Sequential broaching was performed up to a size 6. Calcar planer was used on the femoral neck remnant. I placed a std offset neck and a trial head ball. The hip was reduced. Leg lengths and offset were checked fluoroscopically. The hip was dislocated and trial components were removed. The final implants were placed, and the hip was reduced.  Fluoroscopy was used to confirm component position and leg lengths. At 90 degrees of external rotation and full extension, the hip was stable to an anterior directed force.  The wound was copiously irrigated with normal saline using pulse lavage. Marcaine solution was injected into the periarticular soft tissue. The wound was closed in layers using #1 Vicryl and V-Loc for the fascia, 2-0 Vicryl for the subcutaneous fat, 2-0 Monocryl for the deep dermal layer, 3-0 running Monocryl subcuticular stitch, and Dermabond for the skin. Once the glue was fully dried, an Aquacell Ag dressing was applied. The patient was  transported to the recovery room in stable condition. Sponge, needle, and instrument counts were correct at the end of the case x2. The patient tolerated the procedure well and there were no known complications.  Please note  that a surgical assistant was a medical necessity for this procedure to perform it in a safe and expeditious manner. Assistant was necessary to provide appropriate retraction of vital neurovascular structures, to prevent femoral fracture, and to allow for anatomic placement of the prosthesis.

## 2018-04-10 NOTE — Discharge Instructions (Signed)
°Dr. Johnetta Sloniker °Joint Replacement Specialist °[facility] °[address] °[place], [place] [address] °[phone] ° ° °TOTAL HIP REPLACEMENT POSTOPERATIVE DIRECTIONS ° ° ° °Hip Rehabilitation, Guidelines Following Surgery  ° °WEIGHT BEARING °Weight bearing as tolerated with assist device (walker, cane, etc) as directed, use it as long as suggested by your surgeon or therapist, typically at least 4-6 weeks. ° °The results of a hip operation are greatly improved after range of motion and muscle strengthening exercises. Follow all safety measures which are given to protect your hip. If any of these exercises cause increased pain or swelling in your joint, decrease the amount until you are comfortable again. Then slowly increase the exercises. Call your caregiver if you have problems or questions.  ° °HOME CARE INSTRUCTIONS  °Most of the following instructions are designed to prevent the dislocation of your new hip.  °Remove items at home which could result in a fall. This includes throw rugs or furniture in walking pathways.  °Continue medications as instructed at time of discharge. °· You may have some home medications which will be placed on hold until you complete the course of blood thinner medication. °· You may start showering once you are discharged home. Do not remove your dressing. °Do not put on socks or shoes without following the instructions of your caregivers.   °Sit on chairs with arms. Use the chair arms to help push yourself up when arising.  °Arrange for the use of a toilet seat elevator so you are not sitting low.  °· Walk with walker as instructed.  °You may resume a sexual relationship in one month or when given the OK by your caregiver.  °Use walker as long as suggested by your caregivers.  °You may put full weight on your legs and walk as much as is comfortable. °Avoid periods of inactivity such as sitting longer than an hour when not asleep. This helps prevent  blood clots.  °You may return to work once you are cleared by your surgeon.  °Do not drive a car for 6 weeks or until released by your surgeon.  °Do not drive while taking narcotics.  °Wear elastic stockings for two weeks following surgery during the day but you may remove then at night.  °Make sure you keep all of your appointments after your operation with all of your doctors and caregivers. You should call the office at the above phone number and make an appointment for approximately two weeks after the date of your surgery. °Please pick up a stool softener and laxative for home use as long as you are requiring pain medications. °· ICE to the affected hip every three hours for 30 minutes at a time and then as needed for pain and swelling. Continue to use ice on the hip for pain and swelling from surgery. You may notice swelling that will progress down to the foot and ankle.  This is normal after surgery.  Elevate the leg when you are not up walking on it.   °It is important for you to complete the blood thinner medication as prescribed by your doctor. °· Continue to use the breathing machine which will help keep your temperature down.  It is common for your temperature to cycle up and down following surgery, especially at night when you are not up moving around and exerting yourself.  The breathing machine keeps your lungs expanded and your temperature down. ° °RANGE OF MOTION AND STRENGTHENING EXERCISES  °These exercises are   designed to help you keep full movement of your hip joint. Follow your caregiver's or physical therapist's instructions. Perform all exercises about fifteen times, three times per day or as directed. Exercise both hips, even if you have had only one joint replacement. These exercises can be done on a training (exercise) mat, on the floor, on a table or on a bed. Use whatever works the best and is most comfortable for you. Use music or television while you are exercising so that the exercises  are a pleasant break in your day. This will make your life better with the exercises acting as a break in routine you can look forward to.  °Lying on your back, slowly slide your foot toward your buttocks, raising your knee up off the floor. Then slowly slide your foot back down until your leg is straight again.  °Lying on your back spread your legs as far apart as you can without causing discomfort.  °Lying on your side, raise your upper leg and foot straight up from the floor as far as is comfortable. Slowly lower the leg and repeat.  °Lying on your back, tighten up the muscle in the front of your thigh (quadriceps muscles). You can do this by keeping your leg straight and trying to raise your heel off the floor. This helps strengthen the largest muscle supporting your knee.  °Lying on your back, tighten up the muscles of your buttocks both with the legs straight and with the knee bent at a comfortable angle while keeping your heel on the floor.  ° °SKILLED REHAB INSTRUCTIONS: °If the patient is transferred to a skilled rehab facility following release from the hospital, a list of the current medications will be sent to the facility for the patient to continue.  When discharged from the skilled rehab facility, please have the facility set up the patient's Home Health Physical Therapy prior to being released. Also, the skilled facility will be responsible for providing the patient with their medications at time of release from the facility to include their pain medication and their blood thinner medication. If the patient is still at the rehab facility at time of the two week follow up appointment, the skilled rehab facility will also need to assist the patient in arranging follow up appointment in our office and any transportation needs. ° °MAKE SURE YOU:  °Understand these instructions.  °Will watch your condition.  °Will get help right away if you are not doing well or get worse. ° °Pick up stool softner and  laxative for home use following surgery while on pain medications. °Do not remove your dressing. °The dressing is waterproof--it is OK to take showers. °Continue to use ice for pain and swelling after surgery. °Do not use any lotions or creams on the incision until instructed by your surgeon. °Total Hip Protocol. ° ° °

## 2018-04-10 NOTE — Anesthesia Preprocedure Evaluation (Signed)
Anesthesia Evaluation  Patient identified by MRN, date of birth, ID band Patient awake    Reviewed: Allergy & Precautions, NPO status , Patient's Chart, lab work & pertinent test results  Airway Mallampati: II   Neck ROM: full    Dental   Pulmonary neg pulmonary ROS,    breath sounds clear to auscultation       Cardiovascular negative cardio ROS   Rhythm:regular Rate:Normal     Neuro/Psych    GI/Hepatic   Endo/Other  obese  Renal/GU      Musculoskeletal  (+) Arthritis ,   Abdominal   Peds  Hematology   Anesthesia Other Findings   Reproductive/Obstetrics                             Anesthesia Physical Anesthesia Plan  ASA: II  Anesthesia Plan: Spinal   Post-op Pain Management:    Induction: Intravenous  PONV Risk Score and Plan: 1 and Ondansetron, Propofol infusion and Treatment may vary due to age or medical condition  Airway Management Planned: Simple Face Mask  Additional Equipment:   Intra-op Plan:   Post-operative Plan:   Informed Consent: I have reviewed the patients History and Physical, chart, labs and discussed the procedure including the risks, benefits and alternatives for the proposed anesthesia with the patient or authorized representative who has indicated his/her understanding and acceptance.     Plan Discussed with: CRNA, Surgeon and Anesthesiologist  Anesthesia Plan Comments:         Anesthesia Quick Evaluation

## 2018-04-10 NOTE — Evaluation (Signed)
Physical Therapy Evaluation Patient Details Name: Cole Richardson MRN: [id number] DOB: [birth year] Today's Date: 04/10/2018   History of Present Illness  Pt s/p L THR and with hx of R THR 6/17  Clinical Impression  Pt s/p L THR and presents with decreased L LE strength/ROM and post op pain limiting functional mobility.  Pt should progress to dc home with family assist.  Pt and spouse adamantly stating they would like HHPT follow up as with previous THR in 2017.    Follow Up Recommendations Follow surgeon's recommendation for DC plan and follow-up therapies(Pt adamanantly wants HHPT)    Equipment Recommendations  None recommended by PT    Recommendations for Other Services       Precautions / Restrictions Precautions Precautions: Fall Restrictions Weight Bearing Restrictions: No Other Position/Activity Restrictions: WBAT      Mobility  Bed Mobility Overal bed mobility: Needs Assistance Bed Mobility: Supine to Sit     Supine to sit: Min assist     General bed mobility comments: cues for sequence and use of R LE to self assist  Transfers Overall transfer level: Needs assistance Equipment used: Rolling walker (2 wheeled) Transfers: Sit to/from Stand Sit to Stand: Min assist         General transfer comment: cues for LE management and use of UEs to self assist  Ambulation/Gait Ambulation/Gait assistance: Min assist Gait Distance (Feet): 5 Feet Assistive device: Rolling walker (2 wheeled) Gait Pattern/deviations: Step-to pattern;Decreased step length - right;Decreased step length - left;Shuffle;Trunk flexed Gait velocity: decr   General Gait Details: Cues for sequence and position from RW; pt ltd by residual numbness R foot  Stairs            Wheelchair Mobility    Modified Rankin (Stroke Patients Only)       Balance Overall balance assessment: Needs assistance Sitting-balance support: No upper extremity supported;Feet supported Sitting  balance-Leahy Scale: Good     Standing balance support: Bilateral upper extremity supported Standing balance-Leahy Scale: Fair Standing balance comment: ltd by residual numbness R LE                             Pertinent Vitals/Pain Pain Assessment: 0-10 Pain Score: 4  Pain Location: L hip/thigh Pain Descriptors / Indicators: Aching;Sore Pain Intervention(s): Limited activity within patient's tolerance;Monitored during session;Premedicated before session;Ice applied    Home Living Family/patient expects to be discharged to:: Private residence Living Arrangements: Spouse/significant other Available Help at Discharge: Family Type of Home: Apartment Home Access: Stairs to enter Entrance Stairs-Rails: Doctor, general practice of Steps: 16 Home Layout: One level Home Equipment: Environmental consultant - 2 wheels;Bedside commode;Cane - single point;Crutches      Prior Function Level of Independence: Independent               Hand Dominance        Extremity/Trunk Assessment   Upper Extremity Assessment Upper Extremity Assessment: Overall WFL for tasks assessed    Lower Extremity Assessment Lower Extremity Assessment: LLE deficits/detail    Cervical / Trunk Assessment Cervical / Trunk Assessment: Normal  Communication   Communication: No difficulties  Cognition Arousal/Alertness: Awake/alert Behavior During Therapy: WFL for tasks assessed/performed Overall Cognitive Status: Within Functional Limits for tasks assessed  General Comments      Exercises Total Joint Exercises Ankle Circles/Pumps: AROM;Both;15 reps;Supine   Assessment/Plan    PT Assessment Patient needs continued PT services  PT Problem List Decreased strength;Decreased range of motion;Decreased activity tolerance;Decreased mobility;Decreased knowledge of use of DME;Pain       PT Treatment Interventions DME instruction;Gait  training;Stair training;Functional mobility training;Therapeutic activities;Therapeutic exercise;Patient/family education    PT Goals (Current goals can be found in the Care Plan section)  Acute Rehab PT Goals Patient Stated Goal: Regainb IND PT Goal Formulation: With patient Time For Goal Achievement: 04/17/18 Potential to Achieve Goals: Good    Frequency 7X/week   Barriers to discharge        Co-evaluation               AM-PAC PT "6 Clicks" Mobility  Outcome Measure Help needed turning from your back to your side while in a flat bed without using bedrails?: A Little Help needed moving from lying on your back to sitting on the side of a flat bed without using bedrails?: A Little Help needed moving to and from a bed to a chair (including a wheelchair)?: A Little Help needed standing up from a chair using your arms (e.g., wheelchair or bedside chair)?: A Little Help needed to walk in hospital room?: A Little Help needed climbing 3-5 steps with a railing? : A Lot 6 Click Score: 17    End of Session Equipment Utilized During Treatment: Gait belt Activity Tolerance: Patient tolerated treatment well Patient left: in chair;with call bell/phone within reach;with family/visitor present Nurse Communication: Mobility status PT Visit Diagnosis: Difficulty in walking, not elsewhere classified (R26.2)    Time: [id number] PT Time Calculation (min) (ACUTE ONLY): 28 min   Charges:   PT Evaluation $PT Eval Low Complexity: 1 Low PT Treatments $Therapeutic Activity: 8-22 mins        Mauro KaufmannHunter Tzvi Economou PT Acute Rehabilitation Services Pager [phone] Office [phone]   Rynlee Lisbon 04/10/2018, 4:59 PM

## 2018-04-10 NOTE — Interval H&P Note (Signed)
History and Physical Interval Note:  04/10/2018 8:22 AM  Cole Richardson  has presented today for surgery, with the diagnosis of Avascular necrosis left hip  The various methods of treatment have been discussed with the patient and family. After consideration of risks, benefits and other options for treatment, the patient has consented to  Procedure(s): TOTAL HIP ARTHROPLASTY ANTERIOR APPROACH (Left) as a surgical intervention .  The patient's history has been reviewed, patient examined, no change in status, stable for surgery.  I have reviewed the patient's chart and labs.  Questions were answered to the patient's satisfaction.     Iline OvenBrian J Carmin Dibartolo

## 2018-04-10 NOTE — Anesthesia Procedure Notes (Signed)
Spinal  Patient location during procedure: OR Start time: 04/10/2018 8:40 AM End time: 04/10/2018 8:46 AM Staffing Anesthesiologist: Achille RichHodierne, Aliyah Abeyta, MD Performed: anesthesiologist  Preanesthetic Checklist Completed: patient identified, surgical consent, pre-op evaluation, timeout performed, IV checked, risks and benefits discussed and monitors and equipment checked Spinal Block Patient position: sitting Prep: DuraPrep Patient monitoring: cardiac monitor, continuous pulse ox and blood pressure Approach: midline Location: L3-4 Injection technique: single-shot Needle Needle type: Pencan  Needle gauge: 24 G Needle length: 9 cm Assessment Sensory level: T10 Additional Notes Functioning IV was confirmed and monitors were applied. Sterile prep and drape, including hand hygiene and sterile gloves were used. The patient was positioned and the spine was prepped. The skin was anesthetized with lidocaine.  Free flow of clear CSF was obtained prior to injecting local anesthetic into the CSF.  The spinal needle aspirated freely following injection.  The needle was carefully withdrawn.  The patient tolerated the procedure well.

## 2018-04-11 LAB — BASIC METABOLIC PANEL
ANION GAP: 7 (ref 5–15)
BUN: 16 mg/dL (ref 6–20)
CO2: 24 mmol/L (ref 22–32)
Calcium: 8.5 mg/dL — ABNORMAL LOW (ref 8.9–10.3)
Chloride: 109 mmol/L (ref 98–111)
Creatinine, Ser: 1.03 mg/dL (ref 0.61–1.24)
GFR calc Af Amer: >60 mL/min (ref >60)
GFR calc non Af Amer: >60 mL/min (ref >60)
Glucose, Bld: 93 mg/dL (ref 70–99)
Potassium: 4.6 mmol/L (ref 3.5–5.1)
Sodium: 140 mmol/L (ref 135–145)

## 2018-04-11 LAB — CBC
HEMATOCRIT: 40.3 % (ref 39.0–52.0)
Hemoglobin: 12.6 g/dL — ABNORMAL LOW (ref 13.0–17.0)
MCH: 28.7 pg (ref 26.0–34.0)
MCHC: 31.3 g/dL (ref 30.0–36.0)
MCV: 91.8 fL (ref 80.0–100.0)
NRBC: 0 % (ref 0.0–0.2)
Platelets: 191 10*3/uL (ref 150–400)
RBC: 4.39 MIL/uL (ref 4.22–5.81)
RDW: 12.9 % (ref 11.5–15.5)
WBC: 16.3 10*3/uL — ABNORMAL HIGH (ref 4.0–10.5)

## 2018-04-11 MED ORDER — SENNA 8.6 MG PO TABS: 1.0000 | ORAL_TABLET | Freq: Two times a day (BID) | ORAL | 0 refills | Status: AC

## 2018-04-11 MED ORDER — ONDANSETRON HCL 4 MG PO TABS: 4.0000 mg | ORAL_TABLET | Freq: Four times a day (QID) | ORAL | 0 refills | Status: AC | PRN

## 2018-04-11 MED ORDER — ASPIRIN 81 MG PO CHEW: 81.0000 mg | CHEWABLE_TABLET | Freq: Two times a day (BID) | ORAL | 1 refills | Status: AC

## 2018-04-11 MED ORDER — HYDROMORPHONE HCL 2 MG PO TABS: 2.0000 mg | ORAL_TABLET | Freq: Four times a day (QID) | ORAL | 0 refills | Status: AC | PRN

## 2018-04-11 MED ORDER — DOCUSATE SODIUM 100 MG PO CAPS: 100.0000 mg | ORAL_CAPSULE | Freq: Two times a day (BID) | ORAL | 1 refills | Status: AC

## 2018-04-11 NOTE — Anesthesia Postprocedure Evaluation (Signed)
Anesthesia Post Note  Patient: Cole Richardson  Procedure(s) Performed: TOTAL HIP ARTHROPLASTY ANTERIOR APPROACH (Left )     Patient location during evaluation: PACU Anesthesia Type: Spinal Level of consciousness: oriented and awake and alert Pain management: pain level controlled Vital Signs Assessment: post-procedure vital signs reviewed and stable Respiratory status: spontaneous breathing, respiratory function stable and patient connected to nasal cannula oxygen Cardiovascular status: blood pressure returned to baseline and stable Postop Assessment: no headache, no backache and no apparent nausea or vomiting Anesthetic complications: no    Last Vitals:  Vitals:   04/11/18 1343 04/11/18 1741  BP: 127/68 103/60  Pulse: (!) 57 66  Resp: 18 16  Temp: 37 C 37.2 C  SpO2: 100% 100%    Last Pain:  Vitals:   04/11/18 1523  TempSrc:   PainSc: 4    Pain Goal: Patients Stated Pain Goal: 4 (04/11/18 1423)               Lareina Espino S

## 2018-04-11 NOTE — Progress Notes (Signed)
    Subjective:  Patient reports pain as mild to moderate.  Denies N/V/CP/SOB. C/o R foot paresthesias - states improving  Objective:   VITALS:   Vitals:   04/10/18 2148 04/11/18 0204 04/11/18 0520 04/11/18 0944  BP: 110/68 95/72 106/67 119/67  Pulse: (!) 55 (!) 51 (!) 52 71  Resp:  18 18 18   Temp: 98.8 F (37.1 C) 97.9 F (36.6 C) 97.9 F (36.6 C) 97.7 F (36.5 C)  TempSrc: Oral Oral Oral Oral  SpO2: 98% 98% 99% 99%  Weight:      Height:        NAD ABD soft Sensation intact distally Intact pulses distally Dorsiflexion/Plantar flexion intact Incision: dressing C/D/I Compartment soft   R foot subjective sensory charge plantar aspect of great toe to MTP joint. Nl sensation SP.  Lab Results  Component Value Date   WBC 16.3 (H) 04/11/2018   HGB 12.6 (L) 04/11/2018   HCT 40.3 04/11/2018   MCV 91.8 04/11/2018   PLT 191 04/11/2018   BMET    Component Value Date/Time   NA 140 04/11/2018 0616   K 4.6 04/11/2018 0616   CL 109 04/11/2018 0616   CO2 24 04/11/2018 0616   GLUCOSE 93 04/11/2018 0616   BUN 16 04/11/2018 0616   CREATININE 1.03 04/11/2018 0616   CALCIUM 8.5 (L) 04/11/2018 0616   GFRNONAA >60 04/11/2018 0616   GFRAA >60 04/11/2018 0616     Assessment/Plan: 1 Day Post-Op   Principal Problem:   Avascular necrosis of hip, left (HCC)   WBAT with walker DVT ppx: Aspirin, SCDs, TEDS PO pain control PT/OT Dispo: monitor R foot paresthesias, d/c home tomorrow   Iline OvenBrian J Madyx Delfin 04/11/2018, 11:15 AM   Samson FredericBrian Amariyon Maynes, MD Cell: [phone] [facility]  [place] [address], [place], [place] [address] Phone: [phone] [email or web address] Facebook  [employer]  LinkedIn  Twitter

## 2018-04-11 NOTE — Progress Notes (Signed)
Physical Therapy Treatment Patient Details Name: Cole Richardson MRN: [id number] DOB: 02/05/1976 Today's Date: 04/11/2018    History of Present Illness Pt s/p L THR and with hx of R THR 6/17    PT Comments    Pt progressing steadily with mobility and hopeful for dc home tomorrow.   Follow Up Recommendations  Follow surgeon's recommendation for DC plan and follow-up therapies     Equipment Recommendations  None recommended by PT    Recommendations for Other Services       Precautions / Restrictions Precautions Precautions: Fall Restrictions Weight Bearing Restrictions: No Other Position/Activity Restrictions: WBAT    Mobility  Bed Mobility Overal bed mobility: Needs Assistance Bed Mobility: Supine to Sit;Sit to Supine     Supine to sit: Min guard Sit to supine: Min assist   General bed mobility comments: cues for sequence and use of R LE to self assist  Transfers Overall transfer level: Needs assistance Equipment used: Rolling walker (2 wheeled) Transfers: Sit to/from Stand Sit to Stand: Min guard         General transfer comment: cues for LE management and use of UEs to self assist  Ambulation/Gait Ambulation/Gait assistance: Min guard Gait Distance (Feet): 200 Feet Assistive device: Rolling walker (2 wheeled) Gait Pattern/deviations: Step-to pattern;Decreased step length - right;Decreased step length - left;Shuffle;Trunk flexed Gait velocity: decr   General Gait Details: Increased time with cues for posture and position from [employer]   Stairs             Wheelchair Mobility    Modified Rankin (Stroke Patients Only)       Balance Overall balance assessment: Mild deficits observed, not formally tested Sitting-balance support: No upper extremity supported;Feet supported Sitting balance-Leahy Scale: Good     Standing balance support: No upper extremity supported Standing balance-Leahy Scale: Fair Standing balance comment: Pt reports  partial numbness R foot remains                            Cognition Arousal/Alertness: Awake/alert Behavior During Therapy: WFL for tasks assessed/performed Overall Cognitive Status: Within Functional Limits for tasks assessed                                        Exercises      General Comments        Pertinent Vitals/Pain Pain Assessment: 0-10 Pain Score: 7  Pain Location: L hip/thigh Pain Descriptors / Indicators: Aching;Sore;Burning Pain Intervention(s): Limited activity within patient's tolerance;Monitored during session;Premedicated before session;Ice applied    Home Living                      Prior Function            PT Goals (current goals can now be found in the care plan section) Acute Rehab PT Goals Patient Stated Goal: Regain IND PT Goal Formulation: With patient Time For Goal Achievement: 04/17/18 Potential to Achieve Goals: Good Progress towards PT goals: Progressing toward goals    Frequency    7X/week      PT Plan Current plan remains appropriate    Co-evaluation              AM-PAC PT "6 Clicks" Mobility   Outcome Measure  Help needed turning from your back to your side while in a flat bed  without using bedrails?: A Little Help needed moving from lying on your back to sitting on the side of a flat bed without using bedrails?: A Little Help needed moving to and from a bed to a chair (including a wheelchair)?: A Little Help needed standing up from a chair using your arms (e.g., wheelchair or bedside chair)?: A Little Help needed to walk in hospital room?: A Little Help needed climbing 3-5 steps with a railing? : A Little 6 Click Score: 18    End of Session Equipment Utilized During Treatment: Gait belt Activity Tolerance: Patient tolerated treatment well Patient left: with call bell/phone within reach;with family/visitor present;in chair Nurse Communication: Mobility status PT Visit  Diagnosis: Difficulty in walking, not elsewhere classified (R26.2)     Time: 9562-13081300-1328 PT Time Calculation (min) (ACUTE ONLY): 28 min  Charges:  $Gait Training: 23-37 mins                     Mauro KaufmannHunter Amanda Pote PT Acute Rehabilitation Services Pager [phone] Office [phone]    [name] 04/11/2018, 4:40 PM

## 2018-04-11 NOTE — Discharge Summary (Signed)
Physician Discharge Summary  Patient ID: Cole Richardson MRN: [id number] DOB/AGE: 44/06/1975 44 y.o.  Admit date: 04/10/2018 Discharge date: 04/12/2018  Admission Diagnoses:  Avascular necrosis of hip, left [facility](HCC)  Discharge Diagnoses:  Principal Problem:   Avascular necrosis of hip, left [facility](HCC)   Past Medical History:  Diagnosis Date  . Arthritis   . Minimal change disease    minimal change disorder    Surgeries: Procedure(s): TOTAL HIP ARTHROPLASTY ANTERIOR APPROACH on 04/10/2018   Consultants (if any):   Discharged Condition: Improved  Hospital Course: Cole Richardson is an 42 y.o. male who was admitted 04/10/2018 with a diagnosis of Avascular necrosis of hip, left (HCC) and went to the operating room on 04/10/2018 and underwent the above named procedures.    He was given perioperative antibiotics:  Anti-infectives (From admission, onward)   Start     Dose/Rate Route Frequency Ordered Stop   04/10/18 1500  ceFAZolin (ANCEF) IVPB 2g/100 mL premix     2 g 200 mL/hr over 30 Minutes Intravenous Every 6 hours 04/10/18 1254 04/10/18 2030   04/10/18 0645  ceFAZolin (ANCEF) IVPB 2g/100 mL premix     2 g 200 mL/hr over 30 Minutes Intravenous On call to O.R. 04/10/18 [id number] 04/10/18 [id number]    .  He was given sequential compression devices, early ambulation, and ASA for DVT prophylaxis.  He benefited maximally from the hospital stay and there were no complications.    Recent vital signs:  Vitals:   04/12/18 0146 04/12/18 0650  BP: 117/66 116/69  Pulse: 68 74  Resp: 16 16  Temp: 99 F (37.2 C) 99.8 F (37.7 C)  SpO2: 98% 98%    Recent laboratory studies:  Lab Results  Component Value Date   HGB 11.5 (L) 04/12/2018   HGB 12.6 (L) 04/11/2018   HGB 13.8 04/03/2018   Lab Results  Component Value Date   WBC 14.4 (H) 04/12/2018   PLT 170 04/12/2018   Lab Results  Component Value Date   INR 0.89 07/28/2010   Lab Results  Component Value Date   NA 140  04/11/2018   K 4.6 04/11/2018   CL 109 04/11/2018   CO2 24 04/11/2018   BUN 16 04/11/2018   CREATININE 1.03 04/11/2018   GLUCOSE 93 04/11/2018    Discharge Medications:   Allergies as of 04/12/2018      Reactions   Morphine And Related Nausea And Vomiting   Norco [hydrocodone-acetaminophen] Nausea And Vomiting   Nsaids Other (See Comments)   Minimal change disorder of kidney   Percocet [oxycodone-acetaminophen] Nausea And Vomiting      Medication List    STOP taking these medications   traMADol 50 MG tablet Commonly known as:  ULTRAM     TAKE these medications   aspirin 81 MG chewable tablet Chew 1 tablet (81 mg total) by mouth 2 (two) times daily.   cycloSPORINE modified 100 MG capsule Commonly known as:  NEORAL Take 100 mg by mouth 2 (two) times daily.   docusate sodium 100 MG capsule Commonly known as:  COLACE Take 1 capsule (100 mg total) by mouth 2 (two) times daily.   furosemide 20 MG tablet Commonly known as:  LASIX Take 20 mg by mouth daily as needed for fluid.   HYDROmorphone 2 MG tablet Commonly known as:  DILAUDID Take 1 tablet (2 mg total) by mouth every 6 (six) hours as needed for moderate pain.   losartan 50 MG tablet Commonly known as:  COZAAR Take 25 mg by mouth 2 (two) times daily.   ondansetron 4 MG tablet Commonly known as:  ZOFRAN Take 1 tablet (4 mg total) by mouth every 6 (six) hours as needed for nausea.   senna 8.6 MG Tabs tablet Commonly known as:  SENOKOT Take 1 tablet (8.6 mg total) by mouth 2 (two) times daily.       Diagnostic Studies: Dg Pelvis Portable  Result Date: 04/10/2018 CLINICAL DATA:  Status post left hip arthroplasty. EXAM: PORTABLE PELVIS 1-2 VIEWS COMPARISON:  Radiograph September 30, 2015. FINDINGS: Interval placement of left hip arthroplasty. The femoral and acetabular components appear to be well situated. Expected postoperative changes are seen in the surrounding soft tissues. Previously placed right hip  arthroplasty is again noted and unchanged. IMPRESSION: Status post left total hip arthroplasty. Electronically Signed   By: Lupita RaiderJames  Green Jr, M.D.   On: 04/10/2018 12:07   Dg C-arm 1-60 Min-no Report  Result Date: 04/10/2018 Fluoroscopy was utilized by the requesting physician.  No radiographic interpretation.   Dg Hip Operative Unilat W Or W/o Pelvis Left  Result Date: 04/10/2018 CLINICAL DATA:  42 year old male undergoing left hip surgery. EXAM: OPERATIVE LEFT HIP (WITH PELVIS IF PERFORMED) 6 VIEWS TECHNIQUE: Fluoroscopic spot image(s) were submitted for interpretation post-operatively. COMPARISON:  AP pelvis 09/30/2015. FINDINGS: 6 intraoperative fluoroscopic spot views of the pelvis and left hip in the AP projection. Preexisting right bipolar hip arthroplasty. These images demonstrate left bipolar hip arthroplasty. On the final image the hardware appears intact and normally aligned with no unexpected osseous changes. IMPRESSION: Intraoperative images of left hip arthroplasty with no adverse features identified. Electronically Signed   By: Odessa FlemingH  Hall M.D.   On: 04/10/2018 10:51    Disposition: Discharge disposition: 06-Home-Health Care Svc       Discharge Instructions    Call MD / Call 911   Complete by:  As directed    If you experience chest pain or shortness of breath, CALL 911 and be transported to the hospital emergency room.  If you develope a fever above 101 F, pus (white drainage) or increased drainage or redness at the wound, or calf pain, call your surgeon's office.   Constipation Prevention   Complete by:  As directed    Drink plenty of fluids.  Prune juice may be helpful.  You may use a stool softener, such as Colace (over the counter) 100 mg twice a day.  Use MiraLax (over the counter) for constipation as needed.   Diet - low sodium heart healthy   Complete by:  As directed    Driving restrictions   Complete by:  As directed    No driving for 6 weeks   Increase activity  slowly as tolerated   Complete by:  As directed    Lifting restrictions   Complete by:  As directed    No lifting for 6 weeks   TED hose   Complete by:  As directed    Use stockings (TED hose) for 2 weeks on both leg(s).  You may remove them at night for sleeping.      Follow-up Information    Kashius Dominic, Arlys JohnBrian, MD. Schedule an appointment as soon as possible for a visit in 2 weeks.   Specialty:  Orthopedic Surgery Why:  For wound re-check Contact information: [address] [place] [address] [phone]        [facility] up.   Specialty:  Home Health Services Why:  physical therapy Contact information: [address] [place] [address] [phone]            Signed: Iline Oven Anthoney Sheppard 04/12/2018, 3:27 PM

## 2018-04-11 NOTE — Progress Notes (Signed)
Physical Therapy Treatment Patient Details Name: Cole OysterRoderick A Giglia MRN: [id number] DOB: 07/30/1975 Today's Date: 04/11/2018    History of Present Illness Pt s/p L THR and with hx of R THR 6/17    PT Comments    Pt progressing steadily with mobility but reports continued partial numbness R foot.  Pt and spouse again with strong request for HHPT follow up - advised to speak with Dr Linna CapriceSwinteck.   Follow Up Recommendations  Follow surgeon's recommendation for DC plan and follow-up therapies     Equipment Recommendations  None recommended by PT    Recommendations for Other Services       Precautions / Restrictions Precautions Precautions: Fall Restrictions Weight Bearing Restrictions: No Other Position/Activity Restrictions: WBAT    Mobility  Bed Mobility Overal bed mobility: Needs Assistance Bed Mobility: Supine to Sit;Sit to Supine     Supine to sit: Min assist Sit to supine: Min guard   General bed mobility comments: cues for sequence and use of R LE to self assist  Transfers Overall transfer level: Needs assistance Equipment used: Rolling walker (2 wheeled) Transfers: Sit to/from Stand Sit to Stand: Min assist;Min guard         General transfer comment: cues for LE management and use of UEs to self assist  Ambulation/Gait Ambulation/Gait assistance: Min assist;Min guard Gait Distance (Feet): 120 Feet Assistive device: Rolling walker (2 wheeled) Gait Pattern/deviations: Step-to pattern;Decreased step length - right;Decreased step length - left;Shuffle;Trunk flexed Gait velocity: decr   General Gait Details: Increased time with cues for posture and position from [employer]   Stairs             Wheelchair Mobility    Modified Rankin (Stroke Patients Only)       Balance Overall balance assessment: Mild deficits observed, not formally tested Sitting-balance support: No upper extremity supported;Feet supported Sitting balance-Leahy Scale: Good      Standing balance support: Bilateral upper extremity supported Standing balance-Leahy Scale: Fair Standing balance comment: Pt reports partial numbness R foot remains                            Cognition Arousal/Alertness: Awake/alert Behavior During Therapy: WFL for tasks assessed/performed Overall Cognitive Status: Within Functional Limits for tasks assessed                                        Exercises Total Joint Exercises Ankle Circles/Pumps: AROM;Both;15 reps;Supine Quad Sets: AROM;Both;10 reps;Supine Heel Slides: AAROM;Left;20 reps;Supine Hip ABduction/ADduction: AAROM;Left;15 reps;Supine    General Comments        Pertinent Vitals/Pain Pain Assessment: 0-10 Pain Score: 7  Pain Location: L hip/thigh Pain Descriptors / Indicators: Aching;Sore;Burning Pain Intervention(s): Limited activity within patient's tolerance;Monitored during session;Premedicated before session;Ice applied    Home Living                      Prior Function            PT Goals (current goals can now be found in the care plan section) Acute Rehab PT Goals Patient Stated Goal: Regain IND PT Goal Formulation: With patient Time For Goal Achievement: 04/17/18 Potential to Achieve Goals: Good Progress towards PT goals: Progressing toward goals    Frequency    7X/week      PT Plan Current plan remains appropriate  Co-evaluation              AM-PAC PT "6 Clicks" Mobility   Outcome Measure  Help needed turning from your back to your side while in a flat bed without using bedrails?: A Little Help needed moving from lying on your back to sitting on the side of a flat bed without using bedrails?: A Little Help needed moving to and from a bed to a chair (including a wheelchair)?: A Little Help needed standing up from a chair using your arms (e.g., wheelchair or bedside chair)?: A Little Help needed to walk in hospital room?: A Little Help  needed climbing 3-5 steps with a railing? : A Little 6 Click Score: 18    End of Session Equipment Utilized During Treatment: Gait belt Activity Tolerance: Patient tolerated treatment well Patient left: with call bell/phone within reach;with family/visitor present;in bed Nurse Communication: Mobility status PT Visit Diagnosis: Difficulty in walking, not elsewhere classified (R26.2)     Time: 5621-30860850-0931 PT Time Calculation (min) (ACUTE ONLY): 41 min  Charges:  $Gait Training: 8-22 mins $Therapeutic Exercise: 8-22 mins $Therapeutic Activity: 8-22 mins                     Mauro KaufmannHunter Eliab Closson PT Acute Rehabilitation Services Pager [phone] Office [phone]    Deshana Rominger 04/11/2018, 11:01 AM

## 2018-04-11 NOTE — Care Management Note (Signed)
Case Management Note  Patient Details  Name: Cole Richardson MRN: [id number] Date of Birth: 01/01/1976  Subjective/Objective:         Discharge planning, spoke with patient and spouse at bedside. [facility] and treat.   Action/Plan: [facility]. Has DME. [phone]           Expected Discharge Date:  04/12/18               Expected Discharge Plan:  Home w Home Health Services  In-House Referral:  NA  Discharge planning Services  CM Consult  Post Acute Care Choice:  Home Health Choice offered to:  Patient  DME Arranged:  N/A DME Agency:  NA  HH Arranged:  PT HH Agency:  [facility] ([facility]  Status of Service:  Completed, signed off  If discussed at [employer] Meetings, dates discussed:    Additional Comments:  Alexis Goodelleele, Ketrina Boateng K, RN 04/11/2018, 12:22 PM

## 2018-04-12 ENCOUNTER — Encounter (HOSPITAL_COMMUNITY): Payer: Self-pay | Admitting: Orthopedic Surgery

## 2018-04-12 LAB — CBC
HCT: 36.6 % — ABNORMAL LOW (ref 39.0–52.0)
Hemoglobin: 11.5 g/dL — ABNORMAL LOW (ref 13.0–17.0)
MCH: 28.8 pg (ref 26.0–34.0)
MCHC: 31.4 g/dL (ref 30.0–36.0)
MCV: 91.7 fL (ref 80.0–100.0)
Platelets: 170 10*3/uL (ref 150–400)
RBC: 3.99 MIL/uL — ABNORMAL LOW (ref 4.22–5.81)
RDW: 13.2 % (ref 11.5–15.5)
WBC: 14.4 10*3/uL — ABNORMAL HIGH (ref 4.0–10.5)
nRBC: 0 % (ref 0.0–0.2)

## 2018-04-12 NOTE — Plan of Care (Signed)
Pt alert and oriented, resting with wife at the bedside.  Pain controlled with PO pain meds.  RN will monitor.

## 2018-04-12 NOTE — Progress Notes (Signed)
Physical Therapy Treatment Patient Details Name: Cole OysterRoderick A Drozdowski MRN: [id number] DOB: 10/03/1975 Today's Date: 04/12/2018    History of Present Illness Pt s/p L THR and with hx of R THR 6/17    PT Comments    Pt progressing steadily with mobility.  This am, pt reviewed home therex, car transfers and stairs with spouse present and written instruction provided.    Follow Up Recommendations  Follow surgeon's recommendation for DC plan and follow-up therapies     Equipment Recommendations  None recommended by PT    Recommendations for Other Services       Precautions / Restrictions Precautions Precautions: Fall Restrictions Weight Bearing Restrictions: No Other Position/Activity Restrictions: WBAT    Mobility  Bed Mobility Overal bed mobility: Needs Assistance Bed Mobility: Supine to Sit     Supine to sit: Min guard     General bed mobility comments: cues for sequence and use of R LE to self assist  Transfers Overall transfer level: Needs assistance Equipment used: Rolling walker (2 wheeled) Transfers: Sit to/from Stand Sit to Stand: Min guard         General transfer comment: cues for LE management and use of UEs to self assist  Ambulation/Gait Ambulation/Gait assistance: Min guard;Supervision Gait Distance (Feet): 100 Feet Assistive device: Rolling walker (2 wheeled) Gait Pattern/deviations: Step-to pattern;Decreased step length - right;Decreased step length - left;Shuffle;Trunk flexed Gait velocity: decr   General Gait Details: Increased time with cues for posture and position from RW   Stairs Stairs: Yes Stairs assistance: Min assist Stair Management: One rail Left;Step to pattern;Forwards;With crutches Number of Stairs: 10 General stair comments: min cues for sequence and foot/crutch placement - written instruction provided   Wheelchair Mobility    Modified Rankin (Stroke Patients Only)       Balance Overall balance assessment: Mild  deficits observed, not formally tested                                          Cognition Arousal/Alertness: Awake/alert Behavior During Therapy: WFL for tasks assessed/performed Overall Cognitive Status: Within Functional Limits for tasks assessed                                        Exercises Total Joint Exercises Ankle Circles/Pumps: AROM;Both;15 reps;Supine Quad Sets: AROM;Both;10 reps;Supine Heel Slides: AAROM;Left;20 reps;Supine Hip ABduction/ADduction: AAROM;Left;15 reps;Supine    General Comments        Pertinent Vitals/Pain Pain Assessment: 0-10 Pain Score: 7  Pain Location: L hip/thigh Pain Descriptors / Indicators: Aching;Sore;Burning Pain Intervention(s): Limited activity within patient's tolerance;Monitored during session;Premedicated before session;Ice applied    Home Living                      Prior Function            PT Goals (current goals can now be found in the care plan section) Acute Rehab PT Goals Patient Stated Goal: Regain IND PT Goal Formulation: With patient Time For Goal Achievement: 04/17/18 Potential to Achieve Goals: Good Progress towards PT goals: Progressing toward goals    Frequency    7X/week      PT Plan Current plan remains appropriate    Co-evaluation  AM-PAC PT "6 Clicks" Mobility   Outcome Measure  Help needed turning from your back to your side while in a flat bed without using bedrails?: A Little Help needed moving from lying on your back to sitting on the side of a flat bed without using bedrails?: A Little Help needed moving to and from a bed to a chair (including a wheelchair)?: A Little Help needed standing up from a chair using your arms (e.g., wheelchair or bedside chair)?: A Little Help needed to walk in hospital room?: A Little Help needed climbing 3-5 steps with a railing? : A Little 6 Click Score: 18    End of Session Equipment Utilized  During Treatment: Gait belt Activity Tolerance: Patient tolerated treatment well Patient left: with call bell/phone within reach;with family/visitor present;in chair Nurse Communication: Mobility status PT Visit Diagnosis: Difficulty in walking, not elsewhere classified (R26.2)     Time: [id number] PT Time Calculation (min) (ACUTE ONLY): 39 min  Charges:  $Gait Training: 8-22 mins $Therapeutic Exercise: 8-22 mins $Therapeutic Activity: 8-22 mins                     Mauro KaufmannHunter Fleming Prill PT Acute Rehabilitation Services Pager [phone] Office [phone]    Moksh Loomer 04/12/2018, 12:04 PM

## 2018-04-12 NOTE — Progress Notes (Signed)
    Subjective:  Patient reports pain as mild to moderate.  Denies N/V/CP/SOB. States R foot paresthesias resolved  Objective:   VITALS:   Vitals:   04/11/18 1954 04/11/18 2254 04/12/18 0146 04/12/18 0650  BP: 119/76 107/60 117/66 116/69  Pulse: 63 61 68 74  Resp: 16  16 16   Temp: 98.6 F (37 C)  99 F (37.2 C) 99.8 F (37.7 C)  TempSrc: Oral  Oral   SpO2: 98%  98% 98%  Weight:      Height:        NAD ABD soft Sensation intact distally Intact pulses distally Dorsiflexion/Plantar flexion intact Incision: dressing C/D/I Compartment soft   R foot sensation WNL. 5/5 DF/PF/EHL.  Lab Results  Component Value Date   WBC 14.4 (H) 04/12/2018   HGB 11.5 (L) 04/12/2018   HCT 36.6 (L) 04/12/2018   MCV 91.7 04/12/2018   PLT 170 04/12/2018   BMET    Component Value Date/Time   NA 140 04/11/2018 0616   K 4.6 04/11/2018 0616   CL 109 04/11/2018 0616   CO2 24 04/11/2018 0616   GLUCOSE 93 04/11/2018 0616   BUN 16 04/11/2018 0616   CREATININE 1.03 04/11/2018 0616   CALCIUM 8.5 (L) 04/11/2018 0616   GFRNONAA >60 04/11/2018 0616   GFRAA >60 04/11/2018 0616     Assessment/Plan: 2 Days Post-Op   Principal Problem:   Avascular necrosis of hip, left (HCC)   WBAT with walker DVT ppx: Aspirin, SCDs, TEDS PO pain control PT/OT Dispo: d/c home  Iline OvenBrian J Markie Heffernan 04/12/2018, 12:54 PM   Samson FredericBrian Momen Ham, MD Cell: [phone] [facility]  [place] [address], [place], [place] [address] Phone: [phone] [email or web address] Facebook  [employer]  LinkedIn  Twitter

## 2019-11-01 IMAGING — RF DG HIP (WITH PELVIS) OPERATIVE*L*
1 series · 6 of 6 positions shown · non-contrast
Comparison: AP pelvis 09/30/2015.

CLINICAL DATA: 42-year-old male undergoing left hip surgery.

EXAM:
OPERATIVE LEFT HIP (WITH PELVIS IF PERFORMED) 6 VIEWS
TECHNIQUE: Fluoroscopic spot image(s) were submitted for interpretation
post-operatively.

[Series 1: unknown protocol · 0.20mm/px · 6 of 6 slices shown]
[im 1/6]
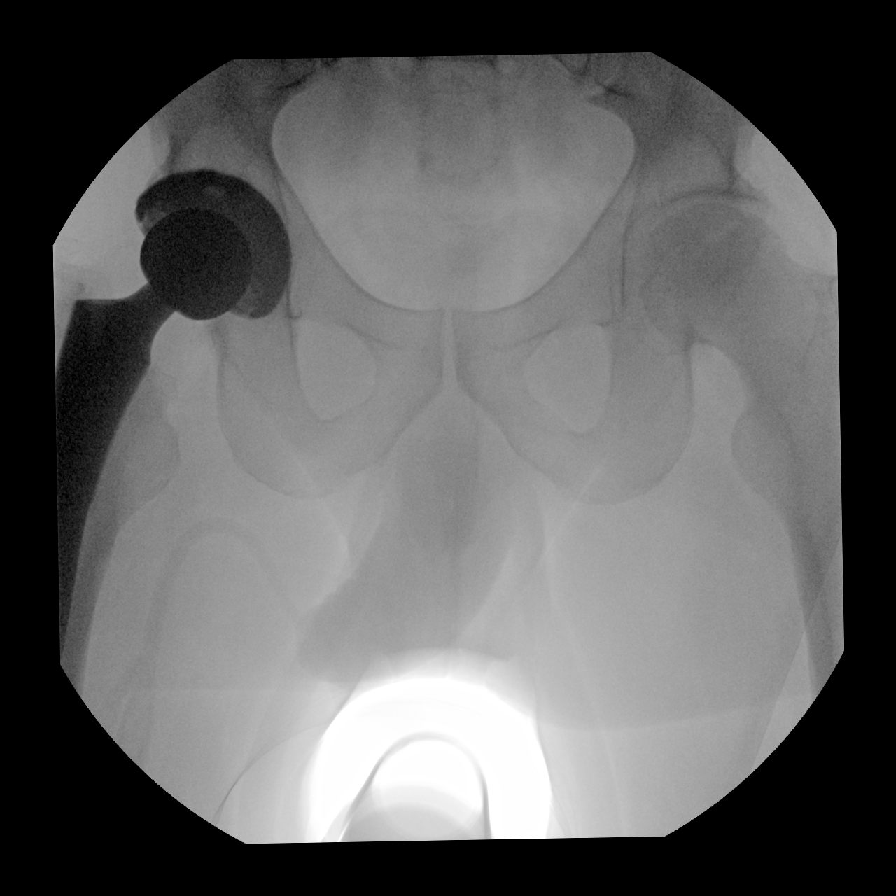
[im 2/6]
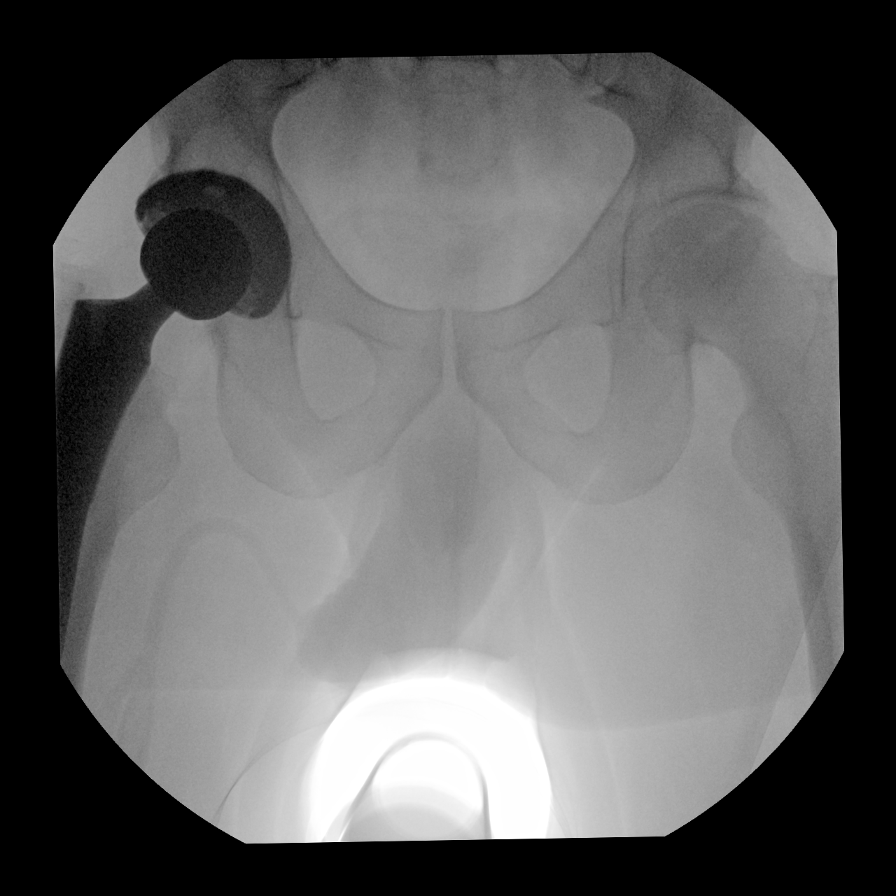
[im 3/6]
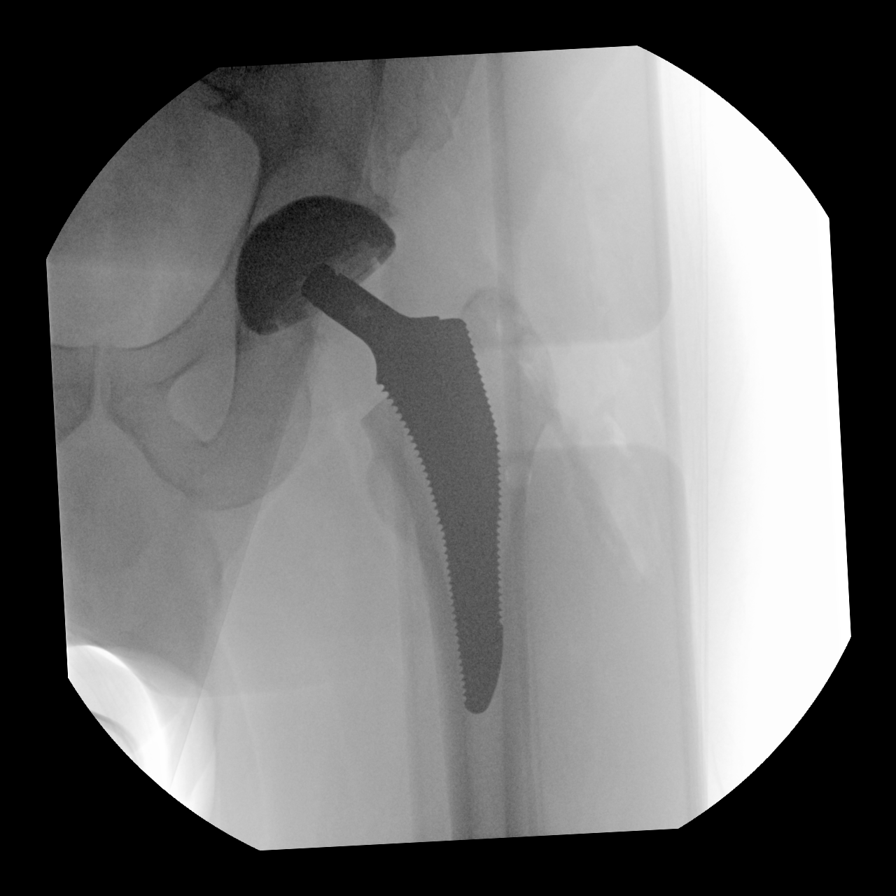
[im 4/6]
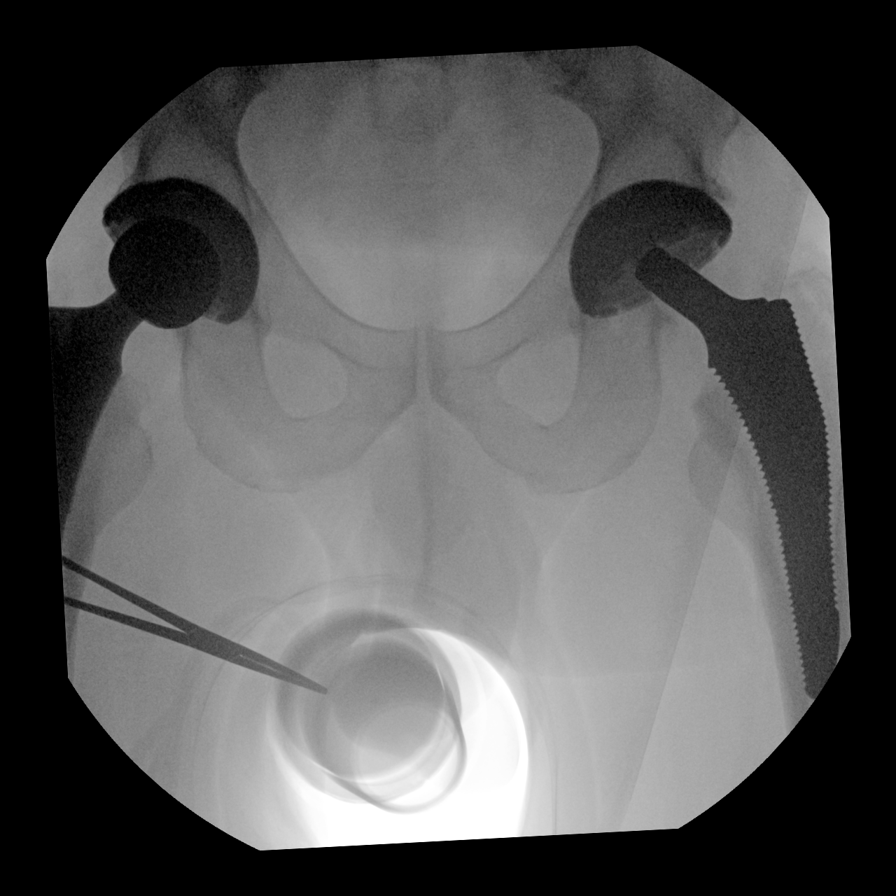
[im 5/6]
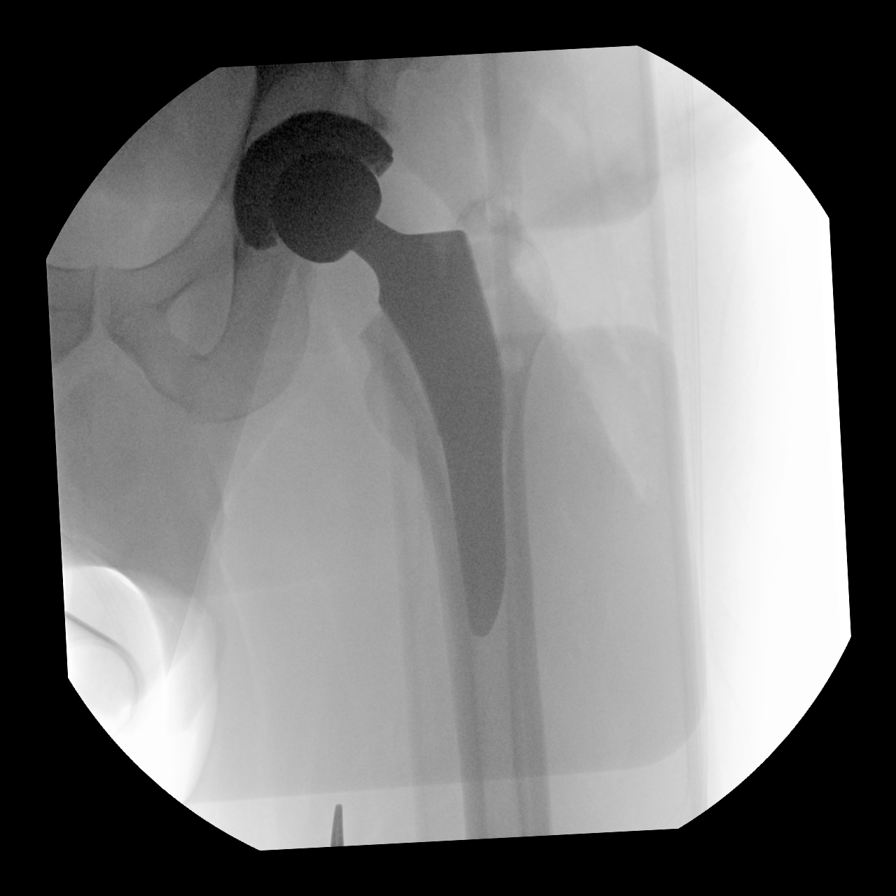
[im 6/6]
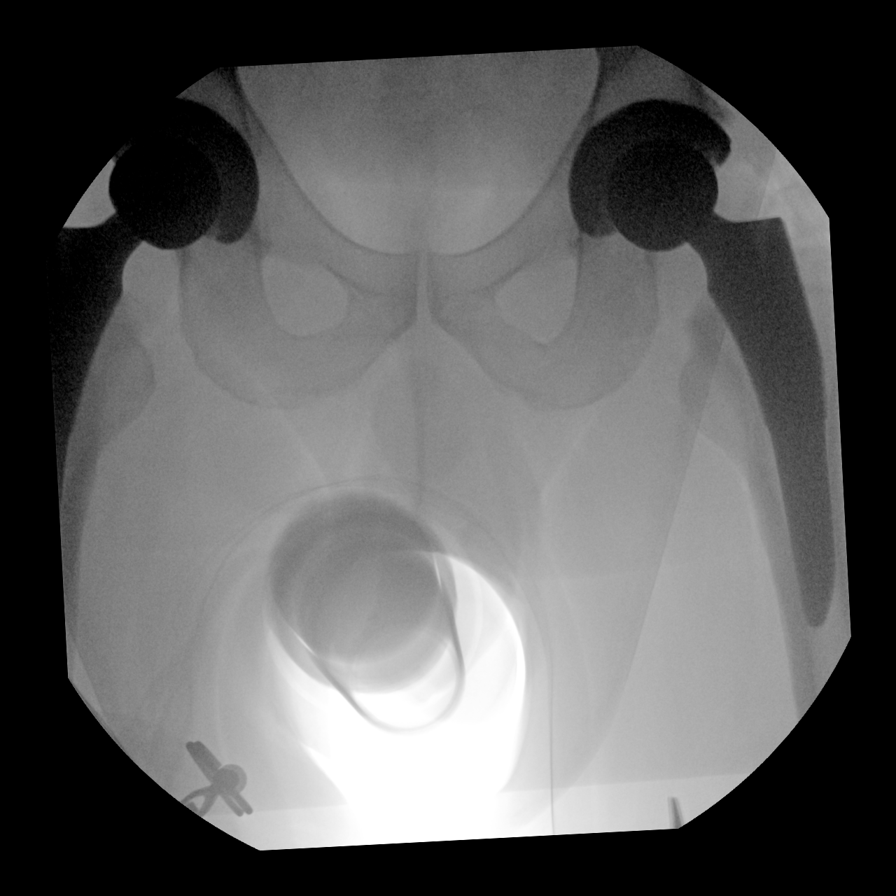

[6 of 6 positions shown; findings below may reference images not displayed]

FINDINGS: 6 intraoperative fluoroscopic spot views of the pelvis and left hip
in the AP projection. Preexisting right bipolar hip arthroplasty.
These images demonstrate left bipolar hip arthroplasty. On the final
image the hardware appears intact and normally aligned with no
unexpected osseous changes.
IMPRESSION: Intraoperative images of left hip arthroplasty with no adverse
features identified.

## 2021-11-16 DIAGNOSIS — B9689 Other specified bacterial agents as the cause of diseases classified elsewhere: Secondary | ICD-10-CM | POA: Diagnosis not present

## 2021-11-16 DIAGNOSIS — J019 Acute sinusitis, unspecified: Secondary | ICD-10-CM | POA: Diagnosis not present

## 2022-01-06 DIAGNOSIS — N182 Chronic kidney disease, stage 2 (mild): Secondary | ICD-10-CM | POA: Diagnosis not present

## 2022-01-12 DIAGNOSIS — N040 Nephrotic syndrome with minor glomerular abnormality: Secondary | ICD-10-CM | POA: Diagnosis not present

## 2022-01-12 DIAGNOSIS — I10 Essential (primary) hypertension: Secondary | ICD-10-CM | POA: Diagnosis not present

## 2022-01-12 DIAGNOSIS — N182 Chronic kidney disease, stage 2 (mild): Secondary | ICD-10-CM | POA: Diagnosis not present

## 2022-12-06 DIAGNOSIS — J069 Acute upper respiratory infection, unspecified: Secondary | ICD-10-CM | POA: Diagnosis not present

## 2023-02-28 DIAGNOSIS — N182 Chronic kidney disease, stage 2 (mild): Secondary | ICD-10-CM | POA: Diagnosis not present

## 2023-02-28 DIAGNOSIS — E559 Vitamin D deficiency, unspecified: Secondary | ICD-10-CM | POA: Diagnosis not present

## 2023-03-08 DIAGNOSIS — N040 Nephrotic syndrome with minor glomerular abnormality: Secondary | ICD-10-CM | POA: Diagnosis not present

## 2023-03-08 DIAGNOSIS — I10 Essential (primary) hypertension: Secondary | ICD-10-CM | POA: Diagnosis not present

## 2023-03-08 DIAGNOSIS — E669 Obesity, unspecified: Secondary | ICD-10-CM | POA: Diagnosis not present

## 2023-04-03 DIAGNOSIS — M9902 Segmental and somatic dysfunction of thoracic region: Secondary | ICD-10-CM | POA: Diagnosis not present

## 2023-04-03 DIAGNOSIS — M5412 Radiculopathy, cervical region: Secondary | ICD-10-CM | POA: Diagnosis not present

## 2023-04-03 DIAGNOSIS — M5414 Radiculopathy, thoracic region: Secondary | ICD-10-CM | POA: Diagnosis not present

## 2023-04-03 DIAGNOSIS — M9901 Segmental and somatic dysfunction of cervical region: Secondary | ICD-10-CM | POA: Diagnosis not present

## 2023-04-04 DIAGNOSIS — M9902 Segmental and somatic dysfunction of thoracic region: Secondary | ICD-10-CM | POA: Diagnosis not present

## 2023-04-04 DIAGNOSIS — M9901 Segmental and somatic dysfunction of cervical region: Secondary | ICD-10-CM | POA: Diagnosis not present

## 2023-04-04 DIAGNOSIS — M5414 Radiculopathy, thoracic region: Secondary | ICD-10-CM | POA: Diagnosis not present

## 2023-04-04 DIAGNOSIS — M5412 Radiculopathy, cervical region: Secondary | ICD-10-CM | POA: Diagnosis not present

## 2023-04-05 DIAGNOSIS — M5412 Radiculopathy, cervical region: Secondary | ICD-10-CM | POA: Diagnosis not present

## 2023-04-05 DIAGNOSIS — M9902 Segmental and somatic dysfunction of thoracic region: Secondary | ICD-10-CM | POA: Diagnosis not present

## 2023-04-05 DIAGNOSIS — M9901 Segmental and somatic dysfunction of cervical region: Secondary | ICD-10-CM | POA: Diagnosis not present

## 2023-04-05 DIAGNOSIS — M5414 Radiculopathy, thoracic region: Secondary | ICD-10-CM | POA: Diagnosis not present

## 2023-04-09 DIAGNOSIS — M5412 Radiculopathy, cervical region: Secondary | ICD-10-CM | POA: Diagnosis not present

## 2023-04-09 DIAGNOSIS — M9901 Segmental and somatic dysfunction of cervical region: Secondary | ICD-10-CM | POA: Diagnosis not present

## 2023-04-09 DIAGNOSIS — M9902 Segmental and somatic dysfunction of thoracic region: Secondary | ICD-10-CM | POA: Diagnosis not present

## 2023-04-09 DIAGNOSIS — M5414 Radiculopathy, thoracic region: Secondary | ICD-10-CM | POA: Diagnosis not present

## 2023-04-11 DIAGNOSIS — M5412 Radiculopathy, cervical region: Secondary | ICD-10-CM | POA: Diagnosis not present

## 2023-04-11 DIAGNOSIS — M9902 Segmental and somatic dysfunction of thoracic region: Secondary | ICD-10-CM | POA: Diagnosis not present

## 2023-04-11 DIAGNOSIS — M9901 Segmental and somatic dysfunction of cervical region: Secondary | ICD-10-CM | POA: Diagnosis not present

## 2023-04-11 DIAGNOSIS — M5414 Radiculopathy, thoracic region: Secondary | ICD-10-CM | POA: Diagnosis not present

## 2023-04-12 DIAGNOSIS — M9902 Segmental and somatic dysfunction of thoracic region: Secondary | ICD-10-CM | POA: Diagnosis not present

## 2023-04-12 DIAGNOSIS — M5414 Radiculopathy, thoracic region: Secondary | ICD-10-CM | POA: Diagnosis not present

## 2023-04-12 DIAGNOSIS — M5412 Radiculopathy, cervical region: Secondary | ICD-10-CM | POA: Diagnosis not present

## 2023-04-12 DIAGNOSIS — M9901 Segmental and somatic dysfunction of cervical region: Secondary | ICD-10-CM | POA: Diagnosis not present

## 2023-04-19 DIAGNOSIS — M5412 Radiculopathy, cervical region: Secondary | ICD-10-CM | POA: Diagnosis not present

## 2023-04-19 DIAGNOSIS — M5414 Radiculopathy, thoracic region: Secondary | ICD-10-CM | POA: Diagnosis not present

## 2023-04-19 DIAGNOSIS — M9901 Segmental and somatic dysfunction of cervical region: Secondary | ICD-10-CM | POA: Diagnosis not present

## 2023-04-19 DIAGNOSIS — M9902 Segmental and somatic dysfunction of thoracic region: Secondary | ICD-10-CM | POA: Diagnosis not present

## 2023-05-22 DIAGNOSIS — Z1331 Encounter for screening for depression: Secondary | ICD-10-CM | POA: Diagnosis not present

## 2023-05-22 DIAGNOSIS — E669 Obesity, unspecified: Secondary | ICD-10-CM | POA: Diagnosis not present

## 2023-08-23 DIAGNOSIS — E669 Obesity, unspecified: Secondary | ICD-10-CM | POA: Diagnosis not present

## 2023-08-23 DIAGNOSIS — Z1211 Encounter for screening for malignant neoplasm of colon: Secondary | ICD-10-CM | POA: Diagnosis not present

## 2023-09-09 DIAGNOSIS — Z1212 Encounter for screening for malignant neoplasm of rectum: Secondary | ICD-10-CM | POA: Diagnosis not present

## 2023-09-09 DIAGNOSIS — Z1211 Encounter for screening for malignant neoplasm of colon: Secondary | ICD-10-CM | POA: Diagnosis not present

## 2024-03-05 DIAGNOSIS — E559 Vitamin D deficiency, unspecified: Secondary | ICD-10-CM | POA: Diagnosis not present

## 2024-03-05 DIAGNOSIS — N182 Chronic kidney disease, stage 2 (mild): Secondary | ICD-10-CM | POA: Diagnosis not present

## 2024-03-13 DIAGNOSIS — N182 Chronic kidney disease, stage 2 (mild): Secondary | ICD-10-CM | POA: Diagnosis not present

## 2024-03-13 DIAGNOSIS — E559 Vitamin D deficiency, unspecified: Secondary | ICD-10-CM | POA: Diagnosis not present

## 2024-03-13 DIAGNOSIS — I10 Essential (primary) hypertension: Secondary | ICD-10-CM | POA: Diagnosis not present

## 2024-03-13 DIAGNOSIS — N040 Nephrotic syndrome with minor glomerular abnormality: Secondary | ICD-10-CM | POA: Diagnosis not present
